# Patient Record
Sex: Female | Born: 1940
Health system: Southern US, Community
[De-identification: ages and names within clinical notes are randomized; demographics above are authoritative.]

## PROBLEM LIST (undated history)

## (undated) DIAGNOSIS — G609 Hereditary and idiopathic neuropathy, unspecified: Secondary | ICD-10-CM

## (undated) DIAGNOSIS — Z860100 Personal history of colon polyps, unspecified: Secondary | ICD-10-CM

## (undated) DIAGNOSIS — J45909 Unspecified asthma, uncomplicated: Secondary | ICD-10-CM

## (undated) DIAGNOSIS — T7840XA Allergy, unspecified, initial encounter: Secondary | ICD-10-CM

## (undated) DIAGNOSIS — E785 Hyperlipidemia, unspecified: Secondary | ICD-10-CM

## (undated) DIAGNOSIS — M1990 Unspecified osteoarthritis, unspecified site: Secondary | ICD-10-CM

## (undated) DIAGNOSIS — M810 Age-related osteoporosis without current pathological fracture: Secondary | ICD-10-CM

## (undated) HISTORY — PX: ABDOMINAL HYSTERECTOMY: SHX81

## (undated) HISTORY — PX: COLONOSCOPY: SHX174

## (undated) HISTORY — PX: APPENDECTOMY: SHX54

---

## 1994-07-21 HISTORY — PX: FOOT SURGERY: SHX648

## 2004-08-08 ENCOUNTER — Ambulatory Visit: Payer: Self-pay | Admitting: Otolaryngology

## 2005-05-27 ENCOUNTER — Ambulatory Visit: Payer: Self-pay | Admitting: Obstetrics and Gynecology

## 2006-06-24 ENCOUNTER — Ambulatory Visit: Payer: Self-pay | Admitting: Obstetrics and Gynecology

## 2007-07-13 ENCOUNTER — Ambulatory Visit: Payer: Self-pay | Admitting: Obstetrics and Gynecology

## 2008-07-18 ENCOUNTER — Ambulatory Visit: Payer: Self-pay | Admitting: Obstetrics and Gynecology

## 2009-06-20 ENCOUNTER — Ambulatory Visit: Payer: Self-pay | Admitting: Internal Medicine

## 2009-06-21 ENCOUNTER — Ambulatory Visit: Payer: Self-pay | Admitting: Obstetrics and Gynecology

## 2009-07-02 ENCOUNTER — Ambulatory Visit: Payer: Self-pay | Admitting: Internal Medicine

## 2009-07-19 ENCOUNTER — Ambulatory Visit: Payer: Self-pay | Admitting: Obstetrics and Gynecology

## 2009-07-21 ENCOUNTER — Ambulatory Visit: Payer: Self-pay | Admitting: Internal Medicine

## 2009-10-29 ENCOUNTER — Ambulatory Visit: Payer: Self-pay | Admitting: Internal Medicine

## 2009-11-18 ENCOUNTER — Ambulatory Visit: Payer: Self-pay | Admitting: Internal Medicine

## 2010-03-04 ENCOUNTER — Ambulatory Visit: Payer: Self-pay | Admitting: Internal Medicine

## 2010-03-04 ENCOUNTER — Ambulatory Visit: Payer: Self-pay

## 2010-03-21 ENCOUNTER — Ambulatory Visit: Payer: Self-pay | Admitting: Internal Medicine

## 2010-04-15 ENCOUNTER — Ambulatory Visit: Payer: Self-pay | Admitting: Internal Medicine

## 2010-04-20 ENCOUNTER — Ambulatory Visit: Payer: Self-pay | Admitting: Internal Medicine

## 2010-05-21 ENCOUNTER — Ambulatory Visit: Payer: Self-pay | Admitting: Internal Medicine

## 2010-06-20 ENCOUNTER — Ambulatory Visit: Payer: Self-pay | Admitting: Internal Medicine

## 2010-07-21 ENCOUNTER — Ambulatory Visit: Payer: Self-pay | Admitting: Internal Medicine

## 2010-07-23 ENCOUNTER — Ambulatory Visit: Payer: Self-pay | Admitting: Obstetrics and Gynecology

## 2011-08-11 ENCOUNTER — Ambulatory Visit: Payer: Self-pay | Admitting: Obstetrics and Gynecology

## 2012-08-18 ENCOUNTER — Ambulatory Visit: Payer: Self-pay | Admitting: Obstetrics and Gynecology

## 2012-12-23 ENCOUNTER — Ambulatory Visit: Payer: Self-pay | Admitting: Surgery

## 2012-12-23 DIAGNOSIS — R0609 Other forms of dyspnea: Secondary | ICD-10-CM

## 2012-12-23 DIAGNOSIS — R0989 Other specified symptoms and signs involving the circulatory and respiratory systems: Secondary | ICD-10-CM

## 2012-12-30 ENCOUNTER — Ambulatory Visit: Payer: Self-pay | Admitting: Surgery

## 2013-08-19 ENCOUNTER — Ambulatory Visit: Payer: Self-pay | Admitting: Obstetrics and Gynecology

## 2014-03-10 ENCOUNTER — Ambulatory Visit: Payer: Self-pay | Admitting: Unknown Physician Specialty

## 2014-03-14 LAB — PATHOLOGY REPORT

## 2014-08-14 ENCOUNTER — Emergency Department: Payer: Self-pay | Admitting: Emergency Medicine

## 2014-08-14 LAB — CBC
HCT: 39.3 % (ref 35.0–47.0)
HGB: 12.9 g/dL (ref 12.0–16.0)
MCH: 33 pg (ref 26.0–34.0)
MCHC: 32.7 g/dL (ref 32.0–36.0)
MCV: 101 fL — ABNORMAL HIGH (ref 80–100)
Platelet: 168 10*3/uL (ref 150–440)
RBC: 3.9 10*6/uL (ref 3.80–5.20)
RDW: 13.9 % (ref 11.5–14.5)
WBC: 4.4 10*3/uL (ref 3.6–11.0)

## 2014-08-14 LAB — BASIC METABOLIC PANEL
Anion Gap: 6 — ABNORMAL LOW (ref 7–16)
BUN: 10 mg/dL (ref 7–18)
Calcium, Total: 9.6 mg/dL (ref 8.5–10.1)
Chloride: 103 mmol/L (ref 98–107)
Co2: 27 mmol/L (ref 21–32)
Creatinine: 0.84 mg/dL (ref 0.60–1.30)
EGFR (African American): >60
EGFR (Non-African Amer.): >60
Glucose: 99 mg/dL (ref 65–99)
Osmolality: 271 (ref 275–301)
Potassium: 3.7 mmol/L (ref 3.5–5.1)
Sodium: 136 mmol/L (ref 136–145)

## 2014-08-14 LAB — TROPONIN I: Troponin-I: <0.02 ng/mL

## 2014-08-21 ENCOUNTER — Ambulatory Visit: Payer: Self-pay | Admitting: Obstetrics and Gynecology

## 2014-09-01 ENCOUNTER — Ambulatory Visit: Payer: Self-pay | Admitting: Obstetrics and Gynecology

## 2014-11-10 NOTE — Op Note (Signed)
PATIENT NAME:  Tiffany, Walton MR#:  [id number] DATE OF BIRTH:  [birth year]  DATE OF PROCEDURE:  12/30/2012  PREOPERATIVE DIAGNOSIS: Left inguinal hernia.   POSTOPERATIVE DIAGNOSIS: Left inguinal hernia.   PROCEDURE: Left inguinal hernia repair.   SURGEON: Rochel Brome, MD   ANESTHESIA: General.   INDICATIONS: This 74 year old female has a history of bulging in the left groin. A left inguinal hernia was demonstrated on physical exam and repair recommended for definitive treatment.   DESCRIPTION OF PROCEDURE: The patient was placed on the operating table in the supine position under general anesthesia. The left lower abdomen was prepared with ChloraPrep and draped in a sterile manner.   A left lower quadrant transversely oriented suprapubic incision was made, carried down through subcutaneous tissues. One traversing vein was divided between 4-0 Vicryl suture ligatures. Several small bleeding points were cauterized. Scarpa's fascia was incised. The external oblique aponeurosis was incised along the course of its fibers exposing an indirect hernia sac which was dissected free from surrounding structures followed up into the internal ring. The sac was opened. Its continuity with the peritoneal cavity was demonstrated. High ligation of the hernia sac was done with a 0 Surgilon suture ligature. The sac was excised. The stump was allowed to retract.  I noted the inferior epigastric vessels. There was a remnant of round ligament which was transected. There was some mild weakness of the floor of the inguinal canal. The repair was carried out with a row of 0 Surgilon sutures suturing the conjoined tendon to the shelving edge of the inguinal ligament incorporating transversalis fascia into the repair and the internal ring was obliterated. Next, the ilioinguinal nerve was replaced along the floor of the inguinal canal. The cut edges of the external oblique aponeurosis were closed with a running 4-0 Vicryl. The  deep fascia superior and lateral to the repair site was infiltrated with 0.5% Sensorcaine with epinephrine. The subcutaneous tissues were infiltrated. Total of 20 mL was used. Scarpa's fascia was closed with interrupted 4-0 Vicryl and the skin was closed with running 4-0 Monocryl subcuticular suture and Dermabond. The patient tolerated surgery satisfactorily and was prepared for transfer to the recovery room. ____________________________ Lenna Sciara. Rochel Brome, MD jws:sb D: 12/30/2012 08:48:07 ET T: 12/30/2012 11:40:24 ET JOB#: [id number]  cc: Loreli Dollar, MD, <Dictator> Loreli Dollar MD ELECTRONICALLY SIGNED 01/10/2013 18:34

## 2015-08-08 ENCOUNTER — Other Ambulatory Visit: Payer: Self-pay | Admitting: Obstetrics and Gynecology

## 2015-08-08 DIAGNOSIS — Z1231 Encounter for screening mammogram for malignant neoplasm of breast: Secondary | ICD-10-CM

## 2015-08-30 ENCOUNTER — Other Ambulatory Visit: Payer: Self-pay | Admitting: Obstetrics and Gynecology

## 2015-08-30 ENCOUNTER — Ambulatory Visit
Admission: RE | Admit: 2015-08-30 | Discharge: 2015-08-30 | Disposition: A | Discharge status: Home / Self Care | Payer: Medicare HMO | Source: Ambulatory Visit | Attending: Obstetrics and Gynecology | Admitting: Obstetrics and Gynecology

## 2015-08-30 DIAGNOSIS — Z1231 Encounter for screening mammogram for malignant neoplasm of breast: Secondary | ICD-10-CM

## 2015-12-28 ENCOUNTER — Other Ambulatory Visit: Payer: Self-pay | Admitting: Orthopedic Surgery

## 2015-12-28 DIAGNOSIS — M542 Cervicalgia: Secondary | ICD-10-CM

## 2015-12-28 DIAGNOSIS — M4726 Other spondylosis with radiculopathy, lumbar region: Secondary | ICD-10-CM

## 2015-12-28 DIAGNOSIS — M5412 Radiculopathy, cervical region: Secondary | ICD-10-CM

## 2015-12-28 DIAGNOSIS — M5030 Other cervical disc degeneration, unspecified cervical region: Secondary | ICD-10-CM

## 2016-01-18 ENCOUNTER — Ambulatory Visit
Admission: RE | Admit: 2016-01-18 | Discharge: 2016-01-18 | Disposition: A | Discharge status: Home / Self Care | Payer: Medicare HMO | Source: Ambulatory Visit | Attending: Orthopedic Surgery | Admitting: Orthopedic Surgery

## 2016-01-18 DIAGNOSIS — M5030 Other cervical disc degeneration, unspecified cervical region: Secondary | ICD-10-CM

## 2016-01-18 DIAGNOSIS — M5412 Radiculopathy, cervical region: Secondary | ICD-10-CM

## 2016-01-18 DIAGNOSIS — M5116 Intervertebral disc disorders with radiculopathy, lumbar region: Secondary | ICD-10-CM | POA: Insufficient documentation

## 2016-01-18 DIAGNOSIS — M4722 Other spondylosis with radiculopathy, cervical region: Secondary | ICD-10-CM | POA: Insufficient documentation

## 2016-01-18 DIAGNOSIS — M4726 Other spondylosis with radiculopathy, lumbar region: Secondary | ICD-10-CM | POA: Diagnosis not present

## 2016-01-18 DIAGNOSIS — M4806 Spinal stenosis, lumbar region: Secondary | ICD-10-CM | POA: Insufficient documentation

## 2016-01-18 DIAGNOSIS — M4802 Spinal stenosis, cervical region: Secondary | ICD-10-CM | POA: Insufficient documentation

## 2016-01-18 DIAGNOSIS — M542 Cervicalgia: Secondary | ICD-10-CM | POA: Diagnosis present

## 2016-06-18 ENCOUNTER — Other Ambulatory Visit: Payer: Self-pay | Admitting: Obstetrics and Gynecology

## 2016-06-18 DIAGNOSIS — Z1231 Encounter for screening mammogram for malignant neoplasm of breast: Secondary | ICD-10-CM

## 2016-09-03 ENCOUNTER — Ambulatory Visit
Admission: RE | Admit: 2016-09-03 | Discharge: 2016-09-03 | Disposition: A | Discharge status: Home / Self Care | Payer: Medicare HMO | Source: Ambulatory Visit | Attending: Obstetrics and Gynecology | Admitting: Obstetrics and Gynecology

## 2016-09-03 DIAGNOSIS — Z1231 Encounter for screening mammogram for malignant neoplasm of breast: Secondary | ICD-10-CM | POA: Diagnosis not present

## 2017-05-08 ENCOUNTER — Encounter: Payer: Self-pay | Admitting: *Deleted

## 2017-05-11 ENCOUNTER — Encounter: Admission: RE | Payer: Self-pay | Source: Ambulatory Visit

## 2017-05-11 ENCOUNTER — Ambulatory Visit
Admission: RE | Admit: 2017-05-11 | Payer: Medicare HMO | Source: Ambulatory Visit | Admitting: Unknown Physician Specialty

## 2017-05-11 HISTORY — DX: Unspecified asthma, uncomplicated: J45.909

## 2017-05-11 HISTORY — DX: Personal history of colonic polyps: Z86.010

## 2017-05-11 HISTORY — DX: Age-related osteoporosis without current pathological fracture: M81.0

## 2017-05-11 HISTORY — DX: Allergy, unspecified, initial encounter: T78.40XA

## 2017-05-11 HISTORY — DX: Hyperlipidemia, unspecified: E78.5

## 2017-05-11 HISTORY — DX: Hereditary and idiopathic neuropathy, unspecified: G60.9

## 2017-05-11 HISTORY — DX: Personal history of colon polyps, unspecified: Z86.0100

## 2017-05-11 SURGERY — COLONOSCOPY WITH PROPOFOL
Anesthesia: General

## 2017-06-17 ENCOUNTER — Other Ambulatory Visit: Payer: Self-pay | Admitting: Obstetrics and Gynecology

## 2017-06-17 DIAGNOSIS — Z1231 Encounter for screening mammogram for malignant neoplasm of breast: Secondary | ICD-10-CM

## 2017-08-04 ENCOUNTER — Encounter: Payer: Self-pay | Admitting: *Deleted

## 2017-08-05 ENCOUNTER — Encounter: Payer: Self-pay | Admitting: *Deleted

## 2017-08-05 ENCOUNTER — Ambulatory Visit: Payer: Medicare HMO | Admitting: Anesthesiology

## 2017-08-05 ENCOUNTER — Ambulatory Visit
Admission: RE | Admit: 2017-08-05 | Discharge: 2017-08-05 | Disposition: A | Discharge status: Home / Self Care | Payer: Medicare HMO | Source: Ambulatory Visit | Attending: Unknown Physician Specialty | Admitting: Unknown Physician Specialty

## 2017-08-05 ENCOUNTER — Encounter
Admission: RE | Disposition: A | Discharge status: Home / Self Care | Payer: Self-pay | Source: Ambulatory Visit | Attending: Unknown Physician Specialty

## 2017-08-05 DIAGNOSIS — J45909 Unspecified asthma, uncomplicated: Secondary | ICD-10-CM | POA: Insufficient documentation

## 2017-08-05 DIAGNOSIS — Z79899 Other long term (current) drug therapy: Secondary | ICD-10-CM | POA: Diagnosis not present

## 2017-08-05 DIAGNOSIS — E785 Hyperlipidemia, unspecified: Secondary | ICD-10-CM | POA: Insufficient documentation

## 2017-08-05 DIAGNOSIS — K640 First degree hemorrhoids: Secondary | ICD-10-CM | POA: Diagnosis not present

## 2017-08-05 DIAGNOSIS — Z1211 Encounter for screening for malignant neoplasm of colon: Secondary | ICD-10-CM | POA: Diagnosis not present

## 2017-08-05 DIAGNOSIS — Z86010 Personal history of colonic polyps: Secondary | ICD-10-CM | POA: Diagnosis not present

## 2017-08-05 DIAGNOSIS — G629 Polyneuropathy, unspecified: Secondary | ICD-10-CM | POA: Diagnosis not present

## 2017-08-05 HISTORY — DX: Unspecified osteoarthritis, unspecified site: M19.90

## 2017-08-05 HISTORY — PX: COLONOSCOPY WITH PROPOFOL: SHX5780

## 2017-08-05 SURGERY — COLONOSCOPY WITH PROPOFOL
Anesthesia: General

## 2017-08-05 MED ORDER — PROPOFOL 10 MG/ML IV BOLUS
INTRAVENOUS | Status: DC | PRN
  Administered 2017-08-05: 300 mg via INTRAVENOUS

## 2017-08-05 MED ORDER — SODIUM CHLORIDE 0.9 % IV SOLN: INTRAVENOUS | Status: DC

## 2017-08-05 MED ORDER — LIDOCAINE HCL (PF) 2 % IJ SOLN
INTRAMUSCULAR | Status: AC
  Filled 2017-08-05: qty 10

## 2017-08-05 MED ORDER — PROPOFOL 500 MG/50ML IV EMUL
INTRAVENOUS | Status: AC
  Filled 2017-08-05: qty 50

## 2017-08-05 MED ORDER — SODIUM CHLORIDE 0.9 % IV SOLN
INTRAVENOUS | Status: DC
  Administered 2017-08-05: 1000 mL via INTRAVENOUS

## 2017-08-05 MED ORDER — LIDOCAINE HCL (CARDIAC) 20 MG/ML IV SOLN
INTRAVENOUS | Status: DC | PRN
  Administered 2017-08-05: 50 mg via INTRAVENOUS

## 2017-08-05 NOTE — Anesthesia Post-op Follow-up Note (Signed)
Anesthesia QCDR form completed.        

## 2017-08-05 NOTE — Op Note (Signed)
[facility] Patient Name: Tiffany Walton Procedure Date: 08/05/2017 12:55 PM MRN: [id number] Account #: [id number] Date of Birth: January 11, 1941 Admit Type: Outpatient Age: 77 Room: [facility] ENDO ROOM 4 Gender: Female Note Status: Finalized Procedure:            Colonoscopy Indications:          Screening for colorectal malignant neoplasm Providers:            Manya Silvas, MD Referring MD:         Irven Easterly. Kary Kos, MD (Referring MD) Medicines:            Propofol per Anesthesia Complications:        No immediate complications. Procedure:            Pre-Anesthesia Assessment:                       - After reviewing the risks and benefits, the patient                        was deemed in satisfactory condition to undergo the                        procedure.                       After obtaining informed consent, the colonoscope was                        passed under direct vision. Throughout the procedure,                        the patient's blood pressure, pulse, and oxygen                        saturations were monitored continuously. The                        Colonoscope was introduced through the anus and                        advanced to the the cecum, identified by appendiceal                        orifice and ileocecal valve. The colonoscopy was                        somewhat difficult due to a tortuous colon. Successful                        completion of the procedure was aided by applying                        abdominal pressure. The patient tolerated the procedure                        well. The quality of the bowel preparation was                        excellent. Findings:      Internal hemorrhoids were found during endoscopy. The hemorrhoids were  small and Grade I (internal hemorrhoids that do not prolapse).      The exam was otherwise without abnormality. Impression:           - Internal hemorrhoids.      - The examination was otherwise normal.                       - No specimens collected. Recommendation:       - The findings and recommendations were discussed with                        the patient. Manya Silvas, MD 08/05/2017 1:21:59 PM This report has been signed electronically. Number of Addenda: 0 Note Initiated On: 08/05/2017 12:55 PM Scope Withdrawal Time: 0 hours 10 minutes 10 seconds  Total Procedure Duration: 0 hours 20 minutes 5 seconds       [facility]

## 2017-08-05 NOTE — Anesthesia Postprocedure Evaluation (Signed)
Anesthesia Post Note  Patient: Tiffany Walton  Procedure(s) Performed: COLONOSCOPY WITH PROPOFOL (N/A )  Patient location during evaluation: PACU Anesthesia Type: General Level of consciousness: awake and alert and oriented Pain management: pain level controlled Vital Signs Assessment: post-procedure vital signs reviewed and stable Respiratory status: spontaneous breathing Cardiovascular status: blood pressure returned to baseline Anesthetic complications: no     Last Vitals:  Vitals:   08/05/17 1330 08/05/17 1340  BP: 107/70 131/74  Pulse:  62  Resp:  (!) 22  Temp:    SpO2:  100%    Last Pain:  Vitals:   08/05/17 1320  TempSrc: Tympanic                 Taquanna Borras

## 2017-08-05 NOTE — Transfer of Care (Signed)
Immediate Anesthesia Transfer of Care Note  Patient: Tiffany Walton  Procedure(s) Performed: COLONOSCOPY WITH PROPOFOL (N/A )  Patient Location: PACU and Endoscopy Unit  Anesthesia Type:MAC  Level of Consciousness: drowsy  Airway & Oxygen Therapy: Patient Spontanous Breathing and Patient connected to nasal cannula oxygen  Post-op Assessment: Report given to RN and Post -op Vital signs reviewed and stable  Post vital signs: Reviewed and stable  Last Vitals:  Vitals:   08/05/17 1225 08/05/17 1320  BP: (!) 144/75 (P) 102/67  Pulse: 74 (P) 83  Resp: 20 (P) 20  Temp: (!) 35.7 C (P) 37 C  SpO2: 100% (P) 100%    Last Pain:  Vitals:   08/05/17 1320  TempSrc: (P) Tympanic         Complications: No apparent anesthesia complications

## 2017-08-05 NOTE — H&P (Signed)
Primary Care Physician:  [place] Pink, MD Primary Gastroenterologist:  Dr. Vira Agar  Pre-Procedure History & Physical: HPI:  Tiffany Walton is a 77 y.o. female is here for an colonoscopy.   Past Medical History:  Diagnosis Date  . Allergic state   . Arthritis    osteoporosis  . Asthma   . History of colon polyps   . Hyperlipidemia   . Osteoporosis, post-menopausal   . Peripheral neuropathy, idiopathic     Past Surgical History:  Procedure Laterality Date  . ABDOMINAL HYSTERECTOMY     TAH  . APPENDECTOMY    . COLONOSCOPY    . FOOT SURGERY  1996    Prior to Admission medications   Medication Sig Start Date End Date Taking? Authorizing Provider  gabapentin (NEURONTIN) 100 MG capsule Take 100 mg by mouth at bedtime.   Yes [provider]  predniSONE (DELTASONE) 10 MG tablet Take 10 mg by mouth daily with breakfast.   Yes [provider]  albuterol (ACCUNEB) 1.25 MG/3ML nebulizer solution Take 1 ampule by nebulization 2 (two) times daily.    [provider]  albuterol (PROVENTIL HFA;VENTOLIN HFA) 108 (90 Base) MCG/ACT inhaler Inhale into the lungs every 6 (six) hours as needed for wheezing or shortness of breath.    [provider]  ascorbic acid (VITAMIN C) 500 MG tablet Take 500 mg by mouth daily.    [provider]  b complex vitamins capsule Take 1 capsule by mouth daily.    [provider]  budesonide-formoterol (SYMBICORT) 160-4.5 MCG/ACT inhaler Inhale 2 puffs into the lungs 2 (two) times daily.    [provider]  cholecalciferol (VITAMIN D) 400 units TABS tablet Take 1,000 Units by mouth daily.    [provider]  COD LIVER OIL PO Take by mouth daily.    [provider]  montelukast (SINGULAIR) 10 MG tablet Take 10 mg by mouth at bedtime.    [provider]  raloxifene (EVISTA) 60 MG tablet Take 60 mg by mouth daily.    [provider]  vitamin E 400 UNIT capsule Take  400 Units by mouth daily.    [provider]    Allergies as of 05/14/2017 - never reviewed  Allergen Reaction Noted  . Shellfish allergy Anaphylaxis 05/08/2017    Family History  Problem Relation Age of Onset  . Breast cancer Neg Hx     Social History   Socioeconomic History  . Marital status: Married    Spouse name: Not on file  . Number of children: Not on file  . Years of education: Not on file  . Highest education level: Not on file  Social Needs  . Financial resource strain: Not on file  . Food insecurity - worry: Not on file  . Food insecurity - inability: Not on file  . Transportation needs - medical: Not on file  . Transportation needs - non-medical: Not on file  Occupational History  . Not on file  Tobacco Use  . Smoking status: Never Smoker  . Smokeless tobacco: Never Used  Substance and Sexual Activity  . Alcohol use: No  . Drug use: No  . Sexual activity: Not on file  Other Topics Concern  . Not on file  Social History Narrative  . Not on file    Review of Systems: See HPI, otherwise negative ROS  Physical Exam: BP (!) 144/75   Pulse 74   Temp (!) 96.3 F (35.7 C) (Tympanic)  Resp 20   Ht 5\' 6"  (1.676 m)   Wt 62.1 kg (137 lb)   SpO2 100%   BMI 22.11 kg/m  General:   Alert,  pleasant and cooperative in NAD Head:  Normocephalic and atraumatic. Neck:  Supple; no masses or thyromegaly. Lungs:  Clear throughout to auscultation.    Heart:  Regular rate and rhythm. Abdomen:  Soft, nontender and nondistended. Normal bowel sounds, without guarding, and without rebound.   Neurologic:  Alert and  oriented x4;  grossly normal neurologically.  Impression/Plan: Tiffany Walton is here for an colonoscopy to be performed for [facility] adenomatous polyp.  Risks, benefits, limitations, and alternatives regarding  colonoscopy have been reviewed with the patient.  Questions have been answered.  All parties agreeable.   Gaylyn Cheers, MD  08/05/2017,  12:52 PM

## 2017-08-05 NOTE — Anesthesia Preprocedure Evaluation (Signed)
Anesthesia Evaluation  Patient identified by MRN, date of birth, ID band Patient awake    Reviewed: Allergy & Precautions, NPO status , Patient's Chart, lab work & pertinent test results  Airway Mallampati: II  TM Distance: >3 FB     Dental  (+) Teeth Intact   Pulmonary asthma ,    Pulmonary exam normal        Cardiovascular negative cardio ROS Normal cardiovascular exam     Neuro/Psych  Neuromuscular disease negative psych ROS   GI/Hepatic negative GI ROS, Neg liver ROS,   Endo/Other  negative endocrine ROS  Renal/GU negative Renal ROS  negative genitourinary   Musculoskeletal  (+) Arthritis , Osteoarthritis,    Abdominal Normal abdominal exam  (+)   Peds negative pediatric ROS (+)  Hematology negative hematology ROS (+)   Anesthesia Other Findings   Reproductive/Obstetrics                             Anesthesia Physical Anesthesia Plan  ASA: II  Anesthesia Plan:    Post-op Pain Management:    Induction: Intravenous  PONV Risk Score and Plan:   Airway Management Planned: Nasal Cannula  Additional Equipment:   Intra-op Plan:   Post-operative Plan:   Informed Consent: I have reviewed the patients History and Physical, chart, labs and discussed the procedure including the risks, benefits and alternatives for the proposed anesthesia with the patient or authorized representative who has indicated his/her understanding and acceptance.   Dental advisory given  Plan Discussed with: CRNA and Surgeon  Anesthesia Plan Comments:         Anesthesia Quick Evaluation

## 2017-08-06 ENCOUNTER — Encounter: Payer: Self-pay | Admitting: Unknown Physician Specialty

## 2017-09-09 ENCOUNTER — Ambulatory Visit
Admission: RE | Admit: 2017-09-09 | Discharge: 2017-09-09 | Disposition: A | Discharge status: Home / Self Care | Payer: Medicare HMO | Source: Ambulatory Visit | Attending: Obstetrics and Gynecology | Admitting: Obstetrics and Gynecology

## 2017-09-09 DIAGNOSIS — Z1231 Encounter for screening mammogram for malignant neoplasm of breast: Secondary | ICD-10-CM

## 2018-09-08 ENCOUNTER — Other Ambulatory Visit: Payer: Self-pay | Admitting: Obstetrics and Gynecology

## 2018-09-08 DIAGNOSIS — Z1231 Encounter for screening mammogram for malignant neoplasm of breast: Secondary | ICD-10-CM

## 2018-09-10 DIAGNOSIS — R05 Cough: Secondary | ICD-10-CM | POA: Diagnosis not present

## 2018-09-10 DIAGNOSIS — J40 Bronchitis, not specified as acute or chronic: Secondary | ICD-10-CM | POA: Diagnosis not present

## 2018-09-14 DIAGNOSIS — Z124 Encounter for screening for malignant neoplasm of cervix: Secondary | ICD-10-CM | POA: Diagnosis not present

## 2018-09-14 DIAGNOSIS — N893 Dysplasia of vagina, unspecified: Secondary | ICD-10-CM | POA: Diagnosis not present

## 2018-09-14 DIAGNOSIS — Z1239 Encounter for other screening for malignant neoplasm of breast: Secondary | ICD-10-CM | POA: Diagnosis not present

## 2018-09-14 DIAGNOSIS — Z1272 Encounter for screening for malignant neoplasm of vagina: Secondary | ICD-10-CM | POA: Diagnosis not present

## 2018-09-17 ENCOUNTER — Ambulatory Visit
Admission: RE | Admit: 2018-09-17 | Discharge: 2018-09-17 | Disposition: A | Discharge status: Home / Self Care | Payer: Medicare HMO | Source: Ambulatory Visit | Attending: Obstetrics and Gynecology | Admitting: Obstetrics and Gynecology

## 2018-09-17 DIAGNOSIS — Z1231 Encounter for screening mammogram for malignant neoplasm of breast: Secondary | ICD-10-CM | POA: Insufficient documentation

## 2018-10-14 DIAGNOSIS — Z1211 Encounter for screening for malignant neoplasm of colon: Secondary | ICD-10-CM | POA: Diagnosis not present

## 2018-11-10 DIAGNOSIS — J4520 Mild intermittent asthma, uncomplicated: Secondary | ICD-10-CM | POA: Diagnosis not present

## 2018-11-10 DIAGNOSIS — Z0000 Encounter for general adult medical examination without abnormal findings: Secondary | ICD-10-CM | POA: Diagnosis not present

## 2018-11-10 DIAGNOSIS — E119 Type 2 diabetes mellitus without complications: Secondary | ICD-10-CM | POA: Diagnosis not present

## 2018-11-10 DIAGNOSIS — E785 Hyperlipidemia, unspecified: Secondary | ICD-10-CM | POA: Diagnosis not present

## 2018-11-15 DIAGNOSIS — Z0000 Encounter for general adult medical examination without abnormal findings: Secondary | ICD-10-CM | POA: Diagnosis not present

## 2018-11-15 DIAGNOSIS — E785 Hyperlipidemia, unspecified: Secondary | ICD-10-CM | POA: Diagnosis not present

## 2018-11-15 DIAGNOSIS — E119 Type 2 diabetes mellitus without complications: Secondary | ICD-10-CM | POA: Diagnosis not present

## 2018-11-25 DIAGNOSIS — J4520 Mild intermittent asthma, uncomplicated: Secondary | ICD-10-CM | POA: Diagnosis not present

## 2018-12-16 DIAGNOSIS — H25813 Combined forms of age-related cataract, bilateral: Secondary | ICD-10-CM | POA: Diagnosis not present

## 2019-04-13 DIAGNOSIS — Z23 Encounter for immunization: Secondary | ICD-10-CM | POA: Diagnosis not present

## 2019-04-13 DIAGNOSIS — E119 Type 2 diabetes mellitus without complications: Secondary | ICD-10-CM | POA: Diagnosis not present

## 2019-04-13 DIAGNOSIS — G609 Hereditary and idiopathic neuropathy, unspecified: Secondary | ICD-10-CM | POA: Diagnosis not present

## 2019-04-13 DIAGNOSIS — Z0000 Encounter for general adult medical examination without abnormal findings: Secondary | ICD-10-CM | POA: Diagnosis not present

## 2019-04-22 DIAGNOSIS — Z23 Encounter for immunization: Secondary | ICD-10-CM | POA: Diagnosis not present

## 2019-05-16 ENCOUNTER — Other Ambulatory Visit: Payer: Self-pay

## 2019-05-16 DIAGNOSIS — R0600 Dyspnea, unspecified: Secondary | ICD-10-CM | POA: Diagnosis not present

## 2019-05-16 DIAGNOSIS — R062 Wheezing: Secondary | ICD-10-CM | POA: Diagnosis not present

## 2019-05-16 DIAGNOSIS — J4520 Mild intermittent asthma, uncomplicated: Secondary | ICD-10-CM | POA: Diagnosis not present

## 2019-05-16 DIAGNOSIS — Z20822 Contact with and (suspected) exposure to covid-19: Secondary | ICD-10-CM

## 2019-05-17 LAB — NOVEL CORONAVIRUS, NAA: SARS-CoV-2, NAA: NOT DETECTED

## 2019-06-06 DIAGNOSIS — G600 Hereditary motor and sensory neuropathy: Secondary | ICD-10-CM | POA: Diagnosis not present

## 2019-06-06 DIAGNOSIS — R413 Other amnesia: Secondary | ICD-10-CM | POA: Diagnosis not present

## 2019-07-20 DIAGNOSIS — I872 Venous insufficiency (chronic) (peripheral): Secondary | ICD-10-CM | POA: Diagnosis not present

## 2019-08-29 ENCOUNTER — Other Ambulatory Visit: Payer: Self-pay | Admitting: Obstetrics and Gynecology

## 2019-08-29 ENCOUNTER — Other Ambulatory Visit: Payer: Self-pay | Admitting: Family Medicine

## 2019-08-29 DIAGNOSIS — Z1231 Encounter for screening mammogram for malignant neoplasm of breast: Secondary | ICD-10-CM

## 2019-09-20 DIAGNOSIS — J310 Chronic rhinitis: Secondary | ICD-10-CM | POA: Diagnosis not present

## 2019-09-20 DIAGNOSIS — M8588 Other specified disorders of bone density and structure, other site: Secondary | ICD-10-CM | POA: Diagnosis not present

## 2019-09-20 DIAGNOSIS — M8580 Other specified disorders of bone density and structure, unspecified site: Secondary | ICD-10-CM | POA: Diagnosis not present

## 2019-09-20 DIAGNOSIS — J4520 Mild intermittent asthma, uncomplicated: Secondary | ICD-10-CM | POA: Diagnosis not present

## 2019-09-20 DIAGNOSIS — Z124 Encounter for screening for malignant neoplasm of cervix: Secondary | ICD-10-CM | POA: Diagnosis not present

## 2019-09-20 DIAGNOSIS — D2371 Other benign neoplasm of skin of right lower limb, including hip: Secondary | ICD-10-CM | POA: Diagnosis not present

## 2019-09-20 DIAGNOSIS — E119 Type 2 diabetes mellitus without complications: Secondary | ICD-10-CM | POA: Diagnosis not present

## 2019-09-20 DIAGNOSIS — R87612 Low grade squamous intraepithelial lesion on cytologic smear of cervix (LGSIL): Secondary | ICD-10-CM | POA: Diagnosis not present

## 2019-09-20 DIAGNOSIS — M79672 Pain in left foot: Secondary | ICD-10-CM | POA: Diagnosis not present

## 2019-09-20 DIAGNOSIS — Z1272 Encounter for screening for malignant neoplasm of vagina: Secondary | ICD-10-CM | POA: Diagnosis not present

## 2019-09-20 DIAGNOSIS — M79671 Pain in right foot: Secondary | ICD-10-CM | POA: Diagnosis not present

## 2019-09-20 DIAGNOSIS — Z1211 Encounter for screening for malignant neoplasm of colon: Secondary | ICD-10-CM | POA: Diagnosis not present

## 2019-09-20 DIAGNOSIS — Z1231 Encounter for screening mammogram for malignant neoplasm of breast: Secondary | ICD-10-CM | POA: Diagnosis not present

## 2019-09-20 DIAGNOSIS — B351 Tinea unguium: Secondary | ICD-10-CM | POA: Diagnosis not present

## 2019-10-17 ENCOUNTER — Ambulatory Visit: Admission: RE | Admit: 2019-10-17 | Payer: Medicare HMO | Source: Ambulatory Visit

## 2019-10-24 ENCOUNTER — Ambulatory Visit
Admission: RE | Admit: 2019-10-24 | Discharge: 2019-10-24 | Disposition: A | Discharge status: Home / Self Care | Payer: Medicare HMO | Source: Ambulatory Visit | Attending: Obstetrics and Gynecology | Admitting: Obstetrics and Gynecology

## 2019-10-24 DIAGNOSIS — Z1231 Encounter for screening mammogram for malignant neoplasm of breast: Secondary | ICD-10-CM | POA: Diagnosis not present

## 2019-11-22 DIAGNOSIS — N890 Mild vaginal dysplasia: Secondary | ICD-10-CM | POA: Diagnosis not present

## 2019-12-22 DIAGNOSIS — M7989 Other specified soft tissue disorders: Secondary | ICD-10-CM | POA: Diagnosis not present

## 2019-12-22 DIAGNOSIS — M1611 Unilateral primary osteoarthritis, right hip: Secondary | ICD-10-CM | POA: Diagnosis not present

## 2019-12-22 DIAGNOSIS — M79671 Pain in right foot: Secondary | ICD-10-CM | POA: Diagnosis not present

## 2019-12-22 DIAGNOSIS — M25561 Pain in right knee: Secondary | ICD-10-CM | POA: Diagnosis not present

## 2019-12-22 DIAGNOSIS — M25551 Pain in right hip: Secondary | ICD-10-CM | POA: Diagnosis not present

## 2019-12-22 DIAGNOSIS — M25461 Effusion, right knee: Secondary | ICD-10-CM | POA: Diagnosis not present

## 2020-02-09 DIAGNOSIS — R296 Repeated falls: Secondary | ICD-10-CM | POA: Diagnosis not present

## 2020-02-09 DIAGNOSIS — R42 Dizziness and giddiness: Secondary | ICD-10-CM | POA: Diagnosis not present

## 2020-02-09 DIAGNOSIS — M25561 Pain in right knee: Secondary | ICD-10-CM | POA: Diagnosis not present

## 2020-02-27 DIAGNOSIS — J4520 Mild intermittent asthma, uncomplicated: Secondary | ICD-10-CM | POA: Diagnosis not present

## 2020-03-01 DIAGNOSIS — R42 Dizziness and giddiness: Secondary | ICD-10-CM | POA: Diagnosis not present

## 2020-03-02 DIAGNOSIS — H52223 Regular astigmatism, bilateral: Secondary | ICD-10-CM | POA: Diagnosis not present

## 2020-03-02 DIAGNOSIS — H5203 Hypermetropia, bilateral: Secondary | ICD-10-CM | POA: Diagnosis not present

## 2020-03-02 DIAGNOSIS — H35373 Puckering of macula, bilateral: Secondary | ICD-10-CM | POA: Diagnosis not present

## 2020-03-02 DIAGNOSIS — H25813 Combined forms of age-related cataract, bilateral: Secondary | ICD-10-CM | POA: Diagnosis not present

## 2020-03-02 DIAGNOSIS — H524 Presbyopia: Secondary | ICD-10-CM | POA: Diagnosis not present

## 2020-03-17 IMAGING — MG DIGITAL SCREENING BILATERAL MAMMOGRAM WITH TOMO AND CAD
8 series · 9 of 24 positions shown · non-contrast
Comparison: Previous exam(s).

CLINICAL DATA: Screening.

EXAM:
DIGITAL SCREENING BILATERAL MAMMOGRAM WITH TOMO AND CAD

[L MLO synth-2D]
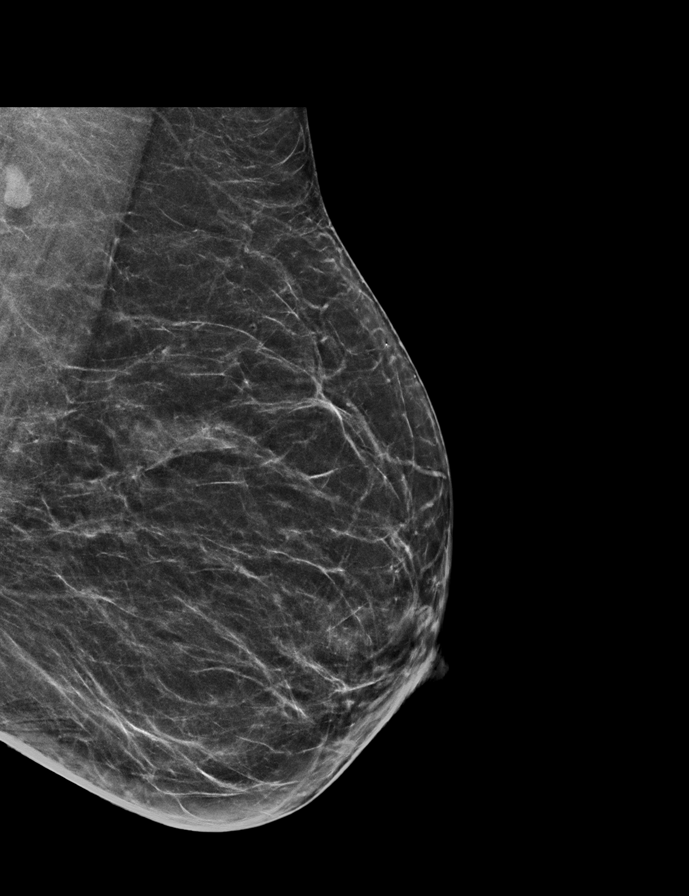

[R MLO synth-2D]
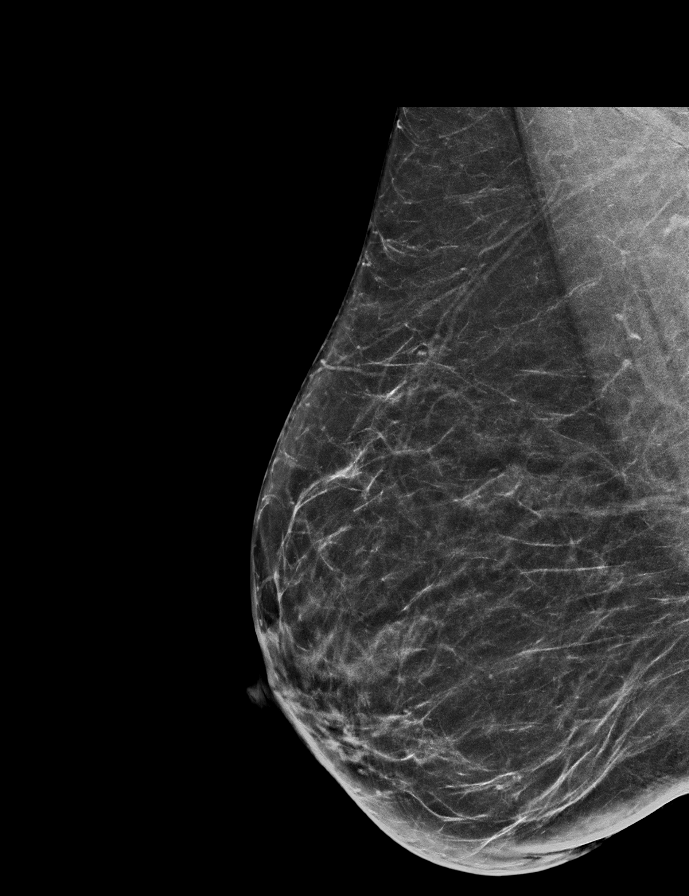

[R CC synth-2D]
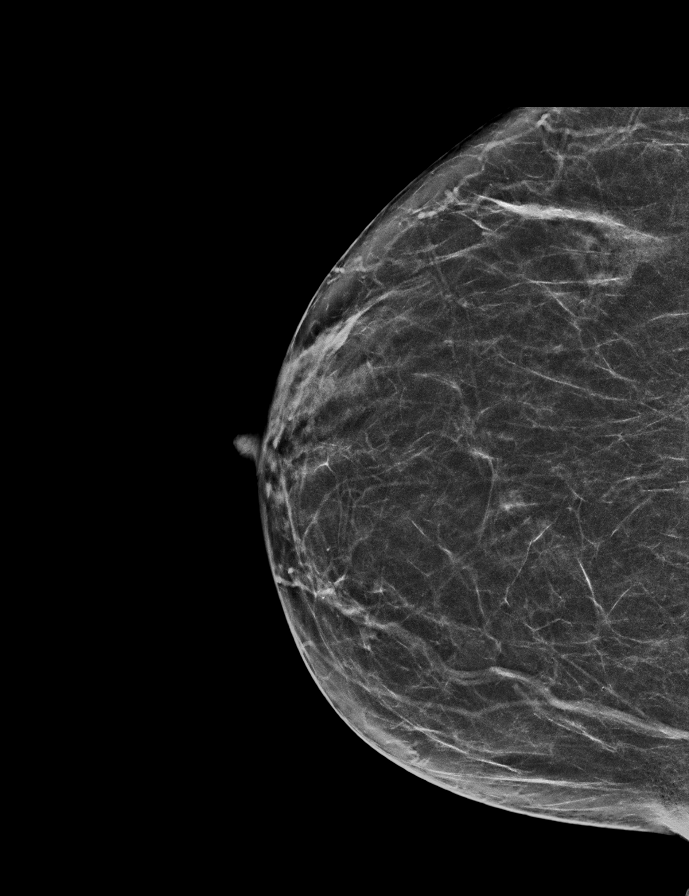

[L CC synth-2D]
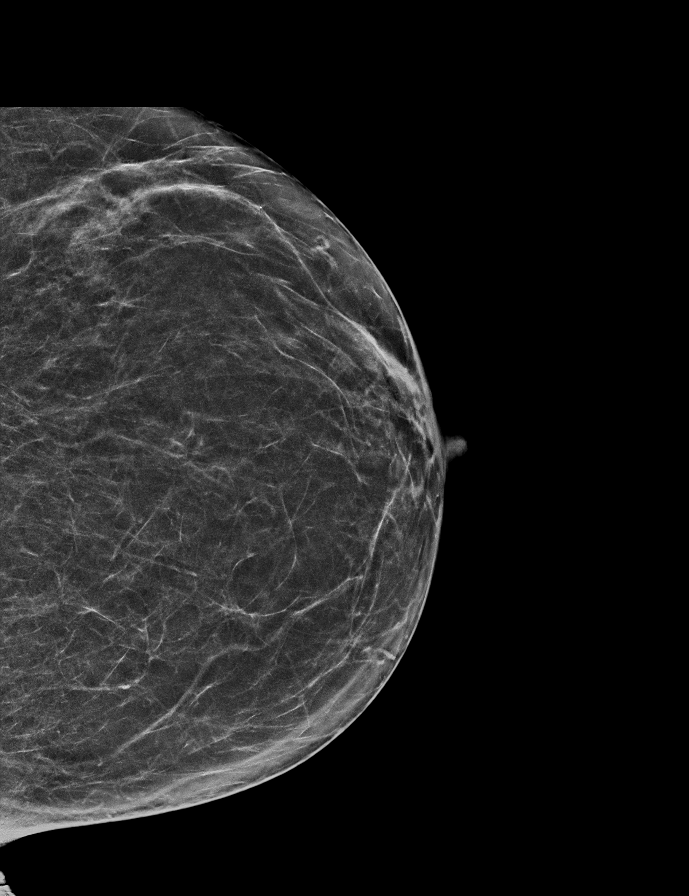

[L MLO tomo · 2 of 64 frames shown]
[frame 21/64]
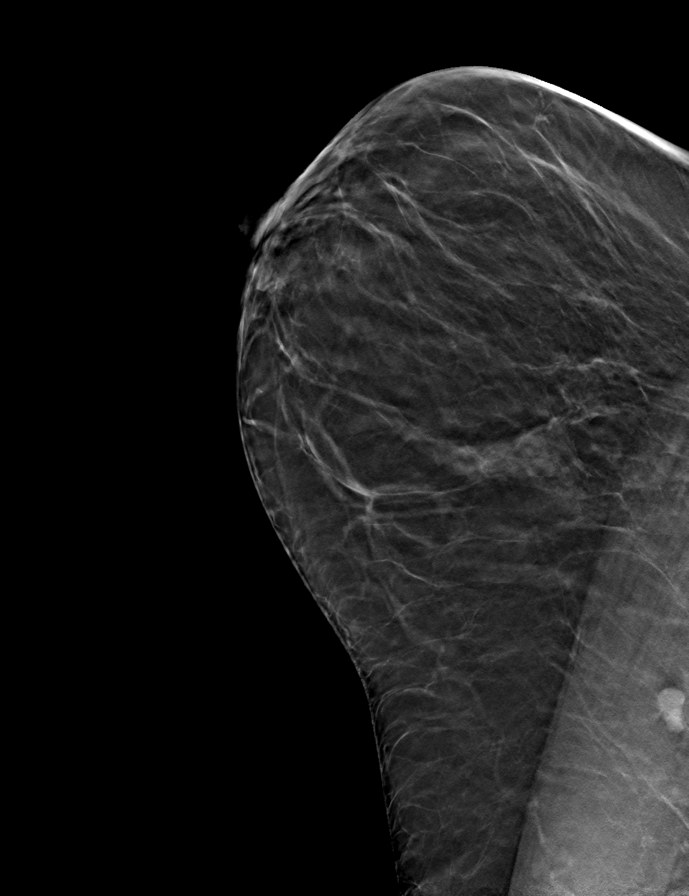
[frame 33/64]
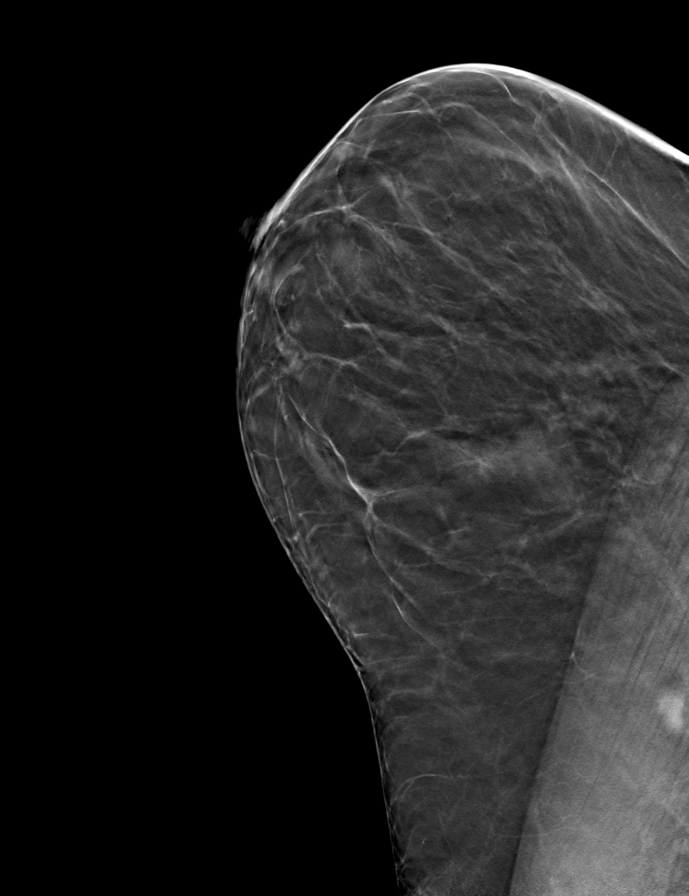

[R MLO tomo · tomo slice 33/66.0]
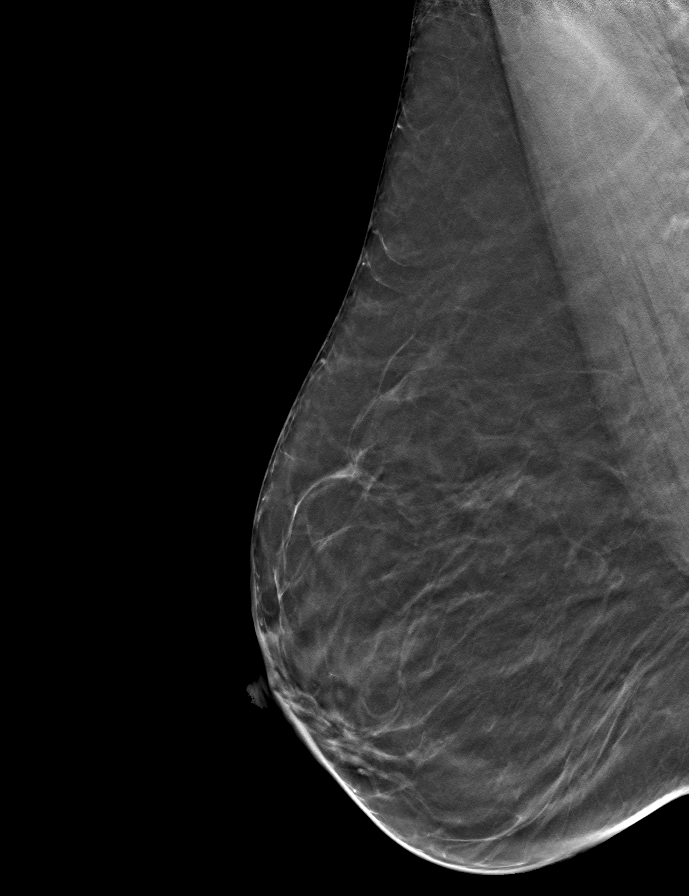

[L CC tomo · tomo slice 29/57.0]
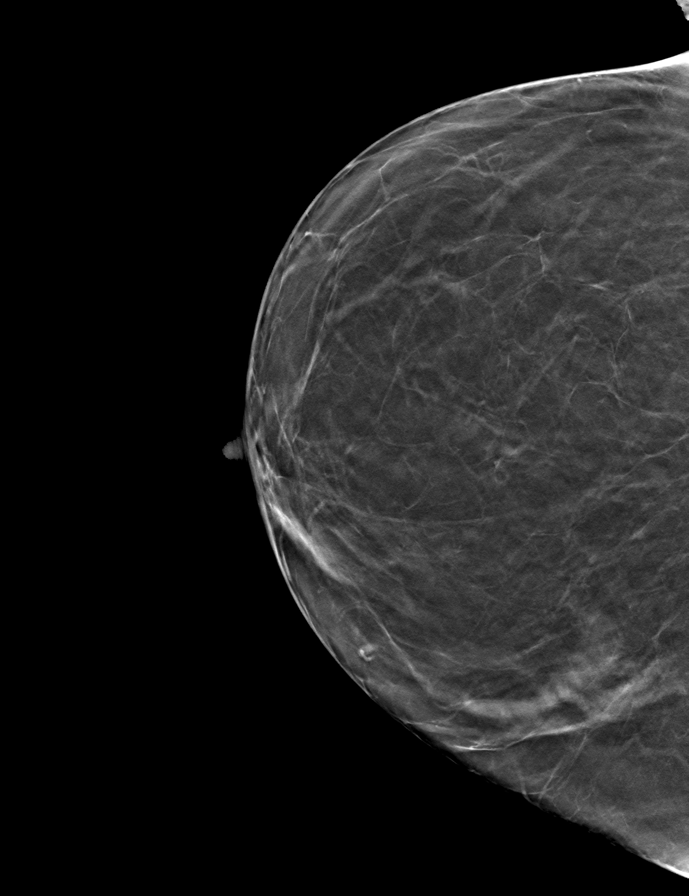

[R CC tomo · tomo slice 31/60.0]
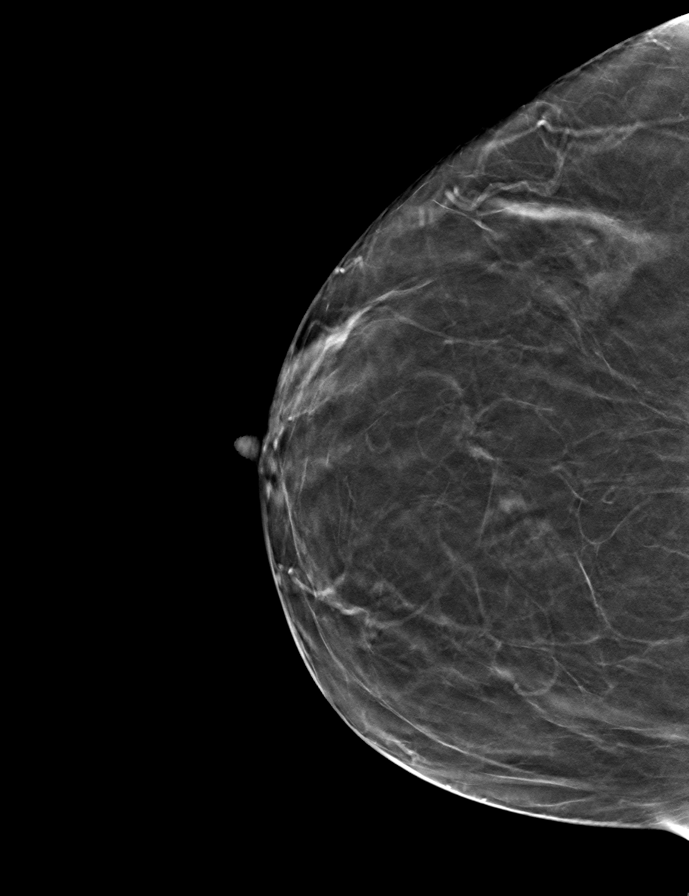

[9 of 24 positions shown; findings below may reference images not displayed]

ACR Breast Density Category b: There are scattered areas of
fibroglandular density.
FINDINGS: There are no findings suspicious for malignancy. Images were
processed with CAD.
IMPRESSION: No mammographic evidence of malignancy. A result letter of this
screening mammogram will be mailed directly to the patient.

RECOMMENDATION:
Screening mammogram in one year. (Code:[id number]-[id number])

BI-RADS CATEGORY  1: Negative.

## 2020-03-23 DIAGNOSIS — Z01818 Encounter for other preprocedural examination: Secondary | ICD-10-CM | POA: Diagnosis not present

## 2020-03-23 DIAGNOSIS — R0600 Dyspnea, unspecified: Secondary | ICD-10-CM | POA: Diagnosis not present

## 2020-04-12 DIAGNOSIS — E119 Type 2 diabetes mellitus without complications: Secondary | ICD-10-CM | POA: Diagnosis not present

## 2020-04-12 DIAGNOSIS — Z0000 Encounter for general adult medical examination without abnormal findings: Secondary | ICD-10-CM | POA: Diagnosis not present

## 2020-04-12 DIAGNOSIS — E785 Hyperlipidemia, unspecified: Secondary | ICD-10-CM | POA: Diagnosis not present

## 2020-04-19 DIAGNOSIS — M25561 Pain in right knee: Secondary | ICD-10-CM | POA: Diagnosis not present

## 2020-04-19 DIAGNOSIS — Z0000 Encounter for general adult medical examination without abnormal findings: Secondary | ICD-10-CM | POA: Diagnosis not present

## 2020-04-19 DIAGNOSIS — Z23 Encounter for immunization: Secondary | ICD-10-CM | POA: Diagnosis not present

## 2020-04-19 DIAGNOSIS — R42 Dizziness and giddiness: Secondary | ICD-10-CM | POA: Diagnosis not present

## 2020-04-19 DIAGNOSIS — G3184 Mild cognitive impairment, so stated: Secondary | ICD-10-CM | POA: Diagnosis not present

## 2020-04-30 DIAGNOSIS — M899 Disorder of bone, unspecified: Secondary | ICD-10-CM | POA: Diagnosis not present

## 2020-04-30 DIAGNOSIS — M25561 Pain in right knee: Secondary | ICD-10-CM | POA: Diagnosis not present

## 2020-05-21 ENCOUNTER — Other Ambulatory Visit: Payer: Self-pay

## 2020-05-21 ENCOUNTER — Ambulatory Visit: Payer: Medicare HMO | Attending: Neurology

## 2020-05-21 DIAGNOSIS — R42 Dizziness and giddiness: Secondary | ICD-10-CM | POA: Diagnosis not present

## 2020-05-21 NOTE — Patient Instructions (Addendum)
Access Code: [email or web address] URL: [email or web address] Date: 05/21/2020 Prepared by: Roxana Hires  Exercises Self-Epley Maneuver Right Ear - 1 x daily - 7 x weekly - 3 reps - 60s in each position hold  Patient Education BPPV

## 2020-05-21 NOTE — Therapy (Signed)
[place] MAIN [facility] SERVICES [address] [place], [place], [address] Phone: [phone]   Fax:  [phone]  Physical Therapy Evaluation  Patient Details  Name: Tiffany Walton MRN: [id number] Date of Birth: May 17, 1941 Referring Provider (PT): Dr. Manuella Ghazi   Encounter Date: 05/21/2020   PT End of Session - 05/21/20 1546    Visit Number 1    Number of Visits 9    Date for PT Re-Evaluation 07/16/20    Authorization Type eval: 05/21/20    PT Start Time 1528    PT Stop Time 1615    PT Time Calculation (min) 47 min    Activity Tolerance Patient tolerated treatment well    Behavior During Therapy [facility] for tasks assessed/performed           Past Medical History:  Diagnosis Date  . Allergic state   . Arthritis    osteoporosis  . Asthma   . History of colon polyps   . Hyperlipidemia   . Osteoporosis, post-menopausal   . Peripheral neuropathy, idiopathic     Past Surgical History:  Procedure Laterality Date  . ABDOMINAL HYSTERECTOMY     TAH  . APPENDECTOMY    . COLONOSCOPY    . COLONOSCOPY WITH PROPOFOL N/A 08/05/2017   Procedure: COLONOSCOPY WITH PROPOFOL;  Surgeon: Manya Silvas, MD;  Location: [facility] ENDOSCOPY;  Service: Endoscopy;  Laterality: N/A;  . FOOT SURGERY  1996    There were no vitals filed for this visit.    Subjective Assessment - 05/21/20 1628    Subjective Dizziness    Pertinent History Pt states she has been having episodes of dizziness since around July 2021. It has been unchanged since it first started. She states that it occurs once to twice per day. Symptoms lasts a few seconds to a few minutes. Pt reports that she feels like she is "blacking out" but "not losing consciousness." Symptoms occur at night after she gets on her knees to say her bedtime prayers and stands up. History of idiopathic neuropathy in bilateral feet for many years. Per neurology note pt also has some low back pain. Pt is not a great  historian and has difficulty describing aggravating and easing factors. Denies history of headaches or migraines. Denies chest pains or palpitations.    Diagnostic tests None reported    Patient Stated Goals Resolve dizziness    Currently in Pain? Other (Comment)   Unrelated to current episode          VESTIBULAR AND BALANCE EVALUATION   HISTORY:  Subjective history of current problem: Pt states she has been having episodes of dizziness since around July 2021. It has been unchanged since it first started. She states that it occurs once to twice per day. Symptoms lasts a few seconds to a few minutes. Pt reports that she feels like she is "blacking out" but "not losing consciousness." Symptoms occur at night after she gets on her knees to say her bedtime prayers and stands up. History of idiopathic neuropathy in bilateral feet for many years. Per neurology note pt also has some low back pain. Pt is not a great historian and has difficulty describing aggravating and easing factors. Denies history of headaches or migraines. Denies chest pains or palpitations.   Description of dizziness: (vertigo, unsteadiness, lightheadedness, falling, general unsteadiness, whoozy, swimmy-headed sensation, aural fullness) lightheadedness, "black out" Frequency: once to twice per day  Duration: seconds to minutes Symptom nature: (motion provoked, positional, spontaneous, constant,  variable, intermittent) motion-provoked, never spontaneous  Provocative Factors: getting up from knees after praying, otherwise pt is unsure what causes symptoms Easing Factors: Waiting for symptoms to pass  Progression of symptoms: (better, worse, no change since onset) no change History of similar episodes: None  Falls (yes/no): No  Prior Functional Level: Independent with ADLs/IADLs, 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     EXAMINATION  POSTURE: WNL  NEUROLOGICAL SCREEN: (2+ unless otherwise noted.) N=normal  Ab=abnormal  Level Dermatome R L Myotome R L Reflex R L  C3 Anterior Neck N N Sidebend C2-3 N N Jaw CN V    C4 Top of Shoulder N N Shoulder Shrug C4 N N Hoffman's UMN    C5 Lateral Upper Arm N N Shoulder ABD C4-5 N N Biceps C5-6    C6 Lateral Arm/ Thumb N N Arm Flex/ Wrist Ext C5-6 N N Brachiorad. C5-6    C7 Middle Finger N N Arm Ext//Wrist Flex C6-7 N N Triceps C7    C8 4th & 5th Finger N N Flex/ Ext Carpi Ulnaris C8 N N Patellar (L3-4)    T1 Medial Arm N N Interossei T1 N N Gastrocnemius    L2 Medial thigh/groin N N Illiopsoas (L2-3) N N     L3 Lower thigh/med.knee N N Quadriceps (L3-4) N N     L4 Medial leg/lat thigh N N Tibialis Ant (L4-5) N N     L5 Lat. leg & dorsal foot N N EHL (L5) N N     S1 post/lat foot/thigh/leg N N Gastrocnemius (S1-2) N N     S2 Post./med. thigh & leg N N Hamstrings (L4-S3) N N       Cranial Nerves Visual acuity and visual fields are intact  Extraocular muscles are intact  Facial sensation is intact bilaterally  Facial strength is intact bilaterally  Hearing is normal as tested by gross conversation Palate elevates midline, normal phonation  Shoulder shrug strength is intact  Tongue protrudes midline    SOMATOSENSORY:         Sensation           Intact      Diminished         Absent  Light touch Normal      COORDINATION: Finger to Nose: Normal Heel to Shin: Normal Pronator Drift: Negative Rapid Alternating Movements: Normal Finger to Thumb Opposition: Normal  MUSCULOSKELETAL SCREEN: Cervical Spine ROM: WFL and painless in all planes. No gross deficits  identified   ROM: WNL  MMT: WNL  Functional Mobility: Independent for transfers and ambulation without UE support;  Gait: Scanning of visual environment with gait is: WNL   Clinical Test of Sensory Interaction for Balance    (CTSIB): Deferred     OCULOMOTOR / VESTIBULAR TESTING:  Oculomotor Exam- Room Light  Findings Comments  Ocular Alignment normal   Ocular ROM normal   Spontaneous Nystagmus normal   Gaze-Holding Nystagmus normal   End-Gaze Nystagmus normal   Vergence (normal 2-3") not examined   Smooth Pursuit abnormal Saccadic  Cross-Cover Test not examined   Saccades normal   VOR Cancellation normal   Left  Head Impulse abnormal Consistent corrective saccade  Right Head Impulse normal   Static Acuity not examined   Dynamic Acuity not examined    Oculomotor Exam- Fixation Suppressed: Deferred  BPPV TESTS:  Symptoms Duration Intensity Nystagmus  L Dix-Hallpike None   None  R Dix-Hallpike Vertigo 10-15s 8/10 Upbeating R torsional nystagmus  L Head Roll None   None  R Head Roll None   None  L Sidelying Test      R Sidelying Test        FUNCTIONAL OUTCOME MEASURES   Results Comments  ABC Scale 95% WNL  DHI 12/100 Mild perception of handicap  FOTO 89 Predicted decline to 92          [facility] PT Assessment - 05/21/20 1630      Assessment   Medical Diagnosis Dizziness    Referring Provider (PT) Dr. Manuella Ghazi    Onset Date/Surgical Date 01/19/20   Approximate   Hand Dominance Right    Next MD Visit not reported    Prior Therapy Not for this issue      Precautions   Precautions None      Restrictions   Weight Bearing Restrictions No      Balance Screen   Has the patient fallen in the past 6 months No    Has the patient had a decrease in activity level because of a fear of falling?  No    Is the patient reluctant to leave their home because of a fear of falling?  No      Home Ecologist residence    Living Arrangements  Spouse/significant other    Available Help at Discharge Family    Type of [place] Two level    Alternate Level Stairs-Number of Steps 10    Alternate Level Stairs-Rails Right      Prior Function   Level of Independence Independent    Vocation Part time employment    Vocation Requirements Works as Scientist, water quality at [employer] Status Within Functional Limits for tasks assessed                  Objective measurements completed on examination: See above findings.      TREATMENT   Canalith Repositioning Treatment Pt treated with 2 bouts of Epley Maneuver for presumed R posterior canal BPPV. 1 minute holds in each position and retesting between maneuvers as well as after final maneuver. Retesting is negative for vertigo and nystagmus. Pt and daughter educated about BPPV and provided instruction about how to perform home Epley maneuver for R side.          PT Education - 05/21/20 1546    Education Details Plan of care, home Epley maneuver, BPPV    Person(s) Educated Patient;Child(ren)    Methods Explanation;Handout;Demonstration    Comprehension Verbalized understanding            PT Short Term Goals - 05/21/20 1549      PT SHORT TERM GOAL #1   Title Pt will be independent with HEP in order to decrease dizziness in order to decrease fall risk and improve function at home and work.    Time 4    Period Weeks    Status New    Target Date 06/18/20             PT Long Term Goals - 05/21/20 1634  PT LONG TERM GOAL #1   Title Pt will report no further episodes of dizziness when getting up from her knees after praying at night    Time 8    Period Weeks    Status New    Target Date 07/16/20      PT LONG TERM GOAL #2   Title Pt will decrease DHI score to 0 in order to demonstrate clinically significant reduction in disability    Baseline 05/21/20: 12/100    Time 8    Period Weeks    Status New    Target Date  07/16/20                  Plan - 05/21/20 1547    Clinical Impression Statement Pt is a pleasant 79 year-old female referred to vestibular physical therapy for dizziness. PT examination positive for R posterior canal BPPV. Pt treated today with the Epley maneuver with resolution of her symptoms. Prescribed home Epley maneuver for HEP. Pt encouraged to follow-up as scheduled for retesting and treatment if necessary. Pt will benefit from skilled PT services to address dizziness/BPPV and improve symptom-free function at home and work.    Personal Factors and Comorbidities Age;Comorbidity 2    Comorbidities OA, osteoporosis, peripheral neuropathy    Examination-Activity Limitations Locomotion Level    Stability/Clinical Decision Making Stable/Uncomplicated    Clinical Decision Making Low    Rehab Potential Good    PT Frequency 1x / week    PT Duration 8 weeks    PT Treatment/Interventions ADLs/Self Care Home Management;Aquatic Therapy;Biofeedback;Canalith Repostioning;Cryotherapy;Electrical Stimulation;Iontophoresis 4mg /ml Dexamethasone;Moist Heat;Traction;Ultrasound;DME Instruction;Gait training;Stair training;Functional mobility training;Therapeutic activities;Therapeutic exercise;Balance training;Neuromuscular re-education;Patient/family education;Manual techniques;Passive range of motion;Dry needling;Vestibular;Joint Manipulations    PT Next Visit Plan Retest for BPPV and treat as appropriate, review home Epley    PT Home Exercise Plan Access Code: [email or web address]    Consulted and Agree with Plan of Care Patient           Patient will benefit from skilled therapeutic intervention in order to improve the following deficits and impairments:  Dizziness  Visit Diagnosis: Dizziness and giddiness - Plan: PT plan of care cert/re-cert     Problem List There are no problems to display for this patient.  Phillips Grout PT, DPT, GCS  Vanita Cannell 05/21/2020, 5:05 PM  [place] MAIN [facility] SERVICES [address] [place], [place], [address] Phone: [phone]   Fax:  [phone]  Name: Tiffany Walton MRN: [id number] Date of Birth: 12-21-1940

## 2020-05-30 ENCOUNTER — Other Ambulatory Visit: Payer: Self-pay

## 2020-05-30 ENCOUNTER — Ambulatory Visit: Payer: Medicare HMO

## 2020-05-30 DIAGNOSIS — M1711 Unilateral primary osteoarthritis, right knee: Secondary | ICD-10-CM | POA: Diagnosis not present

## 2020-05-30 DIAGNOSIS — R42 Dizziness and giddiness: Secondary | ICD-10-CM | POA: Diagnosis not present

## 2020-05-30 NOTE — Therapy (Signed)
[place] MAIN [facility] SERVICES [address] [place], [place], [address] Phone: [phone]   Fax:  [phone]  Physical Therapy Treatment  Patient Details  Name: BRACHA FRANKOWSKI MRN: [id number] Date of Birth: 15-Nov-1940 Referring Provider (PT): Dr. Manuella Ghazi   Encounter Date: 05/30/2020   PT End of Session - 05/30/20 1022    Visit Number 2    Number of Visits 9    Date for PT Re-Evaluation 07/16/20    Authorization Type eval: 05/21/20    PT Start Time 1018    PT Stop Time 1100    PT Time Calculation (min) 42 min    Activity Tolerance Patient tolerated treatment well    Behavior During Therapy [facility] for tasks assessed/performed           Past Medical History:  Diagnosis Date  . Allergic state   . Arthritis    osteoporosis  . Asthma   . History of colon polyps   . Hyperlipidemia   . Osteoporosis, post-menopausal   . Peripheral neuropathy, idiopathic     Past Surgical History:  Procedure Laterality Date  . ABDOMINAL HYSTERECTOMY     TAH  . APPENDECTOMY    . COLONOSCOPY    . COLONOSCOPY WITH PROPOFOL N/A 08/05/2017   Procedure: COLONOSCOPY WITH PROPOFOL;  Surgeon: Manya Silvas, MD;  Location: [facility] ENDOSCOPY;  Service: Endoscopy;  Laterality: N/A;  . FOOT SURGERY  1996    There were no vitals filed for this visit.   Subjective Assessment - 05/30/20 1021    Subjective Pt just had a shot in her R knee this morning so it is a little sore. Pt reports that her dizziness is almost gone and is significantly better than when she came in for the initial evaluation. She has been performing the home Epley maneuver and confirms that it brings on her vertigo. Otherwise no specific questions or concerns.    Pertinent History Pt states she has been having episodes of dizziness since around July 2021. It has been unchanged since it first started. She states that it occurs once to twice per day. Symptoms lasts a few seconds to a few minutes. Pt  reports that she feels like she is "blacking out" but "not losing consciousness." Symptoms occur at night after she gets on her knees to say her bedtime prayers and stands up. History of idiopathic neuropathy in bilateral feet for many years. Per neurology note pt also has some low back pain. Pt is not a great historian and has difficulty describing aggravating and easing factors. Denies history of headaches or migraines. Denies chest pains or palpitations.    Diagnostic tests None reported    Patient Stated Goals Resolve dizziness    Currently in Pain? Yes   R knee soreness, unrelated to current episode              TREATMENT   Canalith Repositioning Treatment Performed Dix-hallpike test which is negative on the left and positive on the R for upbeating R torsional nystagmus which has a 10-15s latency and only lasts for 5-6s. Pt reporting concurrent vertigo. Pt treated with 2 bouts of Epley Maneuver for presumed R posterior canal BPPV. 2 minute holds in each position and retesting between maneuvers. After first maneuver Dix-Hallpike is negative on the R for either vertigo or nystagmus. After second maneuver when sitting up pt has a severe vertigo which lasts for approximately 5s. Retesting is positive for pure horizontal geotrophic nystagmus in the  Dix-Hallpike position. Roll test performed which is positive bilaterally for pure horizontal geotrophic nystagmus. Nystagmus is more severe on the R side as is the vertigo. Pt taken through on bout of the [employer] for presumed R horizontal canalithiasis. Roll test negative afterward. Dix-Hallpike is positive for pure horizontal geotrophic nystagmus with vertigo bilaterally, once again worse on the right. One additional Lempert maneuver performed with patient with 1 minute holds in each position. Will recheck BPPV testing at next visit and treat as appropriate.                        PT Short Term Goals - 05/21/20 1549      PT  SHORT TERM GOAL #1   Title Pt will be independent with HEP in order to decrease dizziness in order to decrease fall risk and improve function at home and work.    Time 4    Period Weeks    Status New    Target Date 06/18/20             PT Long Term Goals - 05/21/20 1634      PT LONG TERM GOAL #1   Title Pt will report no further episodes of dizziness when getting up from her knees after praying at night    Time 8    Period Weeks    Status New    Target Date 07/16/20      PT LONG TERM GOAL #2   Title Pt will decrease DHI score to 0 in order to demonstrate clinically significant reduction in disability    Baseline 05/21/20: 12/100    Time 8    Period Weeks    Status New    Target Date 07/16/20                 Plan - 05/30/20 1022    Clinical Impression Statement Performed Dix-hallpike test which is negative on the left and positive on the R for upbeating R torsional nystagmus which has a 10-15s latency and only lasts for 5-6s. Pt reporting concurrent vertigo. Pt treated with 2 bouts of Epley Maneuver for presumed R posterior canal BPPV. 2 minute holds in each position and retesting between maneuvers. After first maneuver Dix-Hallpike is negative on the R for either vertigo or nystagmus. After second maneuver when sitting up pt has a severe vertigo which lasts for approximately 5s. Retesting is positive for pure horizontal geotrophic nystagmus in the Dix-Hallpike position. Roll test performed which is positive bilaterally for pure horizontal geotrophic nystagmus. Nystagmus is more severe on the R side as is the vertigo. Pt taken through on bout of the [employer] for presumed R horizontal canalithiasis. Roll test negative afterward. Dix-Hallpike is positive for pure horizontal geotrophic nystagmus with vertigo bilaterally, once again worse on the right. One additional Lempert maneuver performed with patient with 1 minute holds in each position. Will recheck BPPV testing at next  visit and treat as appropriate.    Personal Factors and Comorbidities Age;Comorbidity 2    Comorbidities OA, osteoporosis, peripheral neuropathy    Examination-Activity Limitations Locomotion Level    Stability/Clinical Decision Making Stable/Uncomplicated    Rehab Potential Good    PT Frequency 1x / week    PT Duration 8 weeks    PT Treatment/Interventions ADLs/Self Care Home Management;Aquatic Therapy;Biofeedback;Canalith Repostioning;Cryotherapy;Electrical Stimulation;Iontophoresis 4mg /ml Dexamethasone;Moist Heat;Traction;Ultrasound;DME Instruction;Gait training;Stair training;Functional mobility training;Therapeutic activities;Therapeutic exercise;Balance training;Neuromuscular re-education;Patient/family education;Manual techniques;Passive range of motion;Dry needling;Vestibular;Joint Manipulations    PT Next  Visit Plan Retest for BPPV and treat as appropriate, review home Epley    PT Home Exercise Plan Access Code: [email or web address]    Consulted and Agree with Plan of Care Patient           Patient will benefit from skilled therapeutic intervention in order to improve the following deficits and impairments:  Dizziness  Visit Diagnosis: Dizziness and giddiness     Problem List There are no problems to display for this patient.  Phillips Grout PT, DPT, GCS  Chikita Dogan 05/30/2020, 11:12 AM  [place] MAIN [facility] SERVICES [address] [place], [place], [address] Phone: [phone]   Fax:  [phone]  Name: TSERING LEAMAN MRN: [id number] Date of Birth: 11-14-1940

## 2020-06-06 ENCOUNTER — Ambulatory Visit: Payer: Medicare HMO

## 2020-06-06 ENCOUNTER — Other Ambulatory Visit: Payer: Self-pay

## 2020-06-06 DIAGNOSIS — R42 Dizziness and giddiness: Secondary | ICD-10-CM | POA: Diagnosis not present

## 2020-06-06 DIAGNOSIS — M1711 Unilateral primary osteoarthritis, right knee: Secondary | ICD-10-CM | POA: Diagnosis not present

## 2020-06-06 NOTE — Therapy (Signed)
[place] MAIN [facility] SERVICES [address] [place], [place], [address] Phone: [phone]   Fax:  [phone]  Physical Therapy Treatment  Patient Details  Name: Tiffany Walton MRN: [id number] Date of Birth: [birth year] Referring Provider (PT): Dr. Manuella Ghazi   Encounter Date: 06/06/2020   PT End of Session - 06/06/20 1026    Visit Number 3    Number of Visits 9    Date for PT Re-Evaluation 07/16/20    Authorization Type eval: 05/21/20    PT Start Time 1024    PT Stop Time 1055    PT Time Calculation (min) 31 min    Activity Tolerance Patient tolerated treatment well    Behavior During Therapy [facility] for tasks assessed/performed           Past Medical History:  Diagnosis Date  . Allergic state   . Arthritis    osteoporosis  . Asthma   . History of colon polyps   . Hyperlipidemia   . Osteoporosis, post-menopausal   . Peripheral neuropathy, idiopathic     Past Surgical History:  Procedure Laterality Date  . ABDOMINAL HYSTERECTOMY     TAH  . APPENDECTOMY    . COLONOSCOPY    . COLONOSCOPY WITH PROPOFOL N/A 08/05/2017   Procedure: COLONOSCOPY WITH PROPOFOL;  Surgeon: Manya Silvas, MD;  Location: [facility] ENDOSCOPY;  Service: Endoscopy;  Laterality: N/A;  . FOOT SURGERY  1996    There were no vitals filed for this visit.   Subjective Assessment - 06/06/20 1026    Subjective Pt reports that her dizziness is still present but significantly improved from the the initial evaluation. She continues with chronic R knee pain and is scheduled to get another injection today. Otherwise no specific questions or concerns.    Pertinent History Pt states she has been having episodes of dizziness since around July 2021. It has been unchanged since it first started. She states that it occurs once to twice per day. Symptoms lasts a few seconds to a few minutes. Pt reports that she feels like she is "blacking out" but "not losing consciousness." Symptoms  occur at night after she gets on her knees to say her bedtime prayers and stands up. History of idiopathic neuropathy in bilateral feet for many years. Per neurology note pt also has some low back pain. Pt is not a great historian and has difficulty describing aggravating and easing factors. Denies history of headaches or migraines. Denies chest pains or palpitations.    Diagnostic tests None reported    Patient Stated Goals Resolve dizziness    Currently in Pain? Yes   chronic R knee pain, unrelated to current episode              TREATMENT   Canalith Repositioning Treatment On inverted mat table performed Dix-hallpike test which is negative on the left and positive on the R for upbeating R torsional nystagmus which has a 5s latency and only lasts for approximately 10s. Pt reporting concurrent vertigo. Pt treated with 1 bout of Epley Maneuver for presumed R posterior canal BPPV. 2 minute holds in each position and retesting afterward. Performed R sidelying test after first maneuver which is positive for pure horizontal geotrophic nystagmus which is very vigorous. Completed 1 Liberatory maneuver for R side with 1 minute holds in each position. Roll test performed afterward which is positive for bilateral pure horizontal geotrophic nystagmus which is more vigorous on the R side. Pt taken through two  Gufoni maneuvers for R horizontal canalithiasis BPPV (L sidelying for 45s followed by one minute nose down). During second Gufoni maneuver pt has no nystagmus or vertigo in the L sidelying position. Retested pt in R sidelying position, bilateral horizontal roll test, and R Dix-Hallpike and they are all negative for either vertigo or nystagmus. Upon standing pt is slightly more unsteady so assisted pt to the waiting room and volunteer services contacted to bring down a transport chair. Pt and sister advised that pt will need to be extra careful today due to unsteadiness. Symptoms should resolve in 24-48  hours.                                    PT Short Term Goals - 05/21/20 1549      PT SHORT TERM GOAL #1   Title Pt will be independent with HEP in order to decrease dizziness in order to decrease fall risk and improve function at home and work.    Time 4    Period Weeks    Status New    Target Date 06/18/20             PT Long Term Goals - 05/21/20 1634      PT LONG TERM GOAL #1   Title Pt will report no further episodes of dizziness when getting up from her knees after praying at night    Time 8    Period Weeks    Status New    Target Date 07/16/20      PT LONG TERM GOAL #2   Title Pt will decrease DHI score to 0 in order to demonstrate clinically significant reduction in disability    Baseline 05/21/20: 12/100    Time 8    Period Weeks    Status New    Target Date 07/16/20                 Plan - 06/06/20 1027    Clinical Impression Statement On inverted mat table performed Dix-hallpike test which is negative on the left and positive on the R for upbeating R torsional nystagmus which has a 5s latency and only lasts for approximately 10s. Pt reporting concurrent vertigo. Pt treated with 1 bout of Epley Maneuver for presumed R posterior canal BPPV. 2 minute holds in each position and retesting afterward. Performed R sidelying test after first maneuver which is positive for pure horizontal geotrophic nystagmus which is very vigorous. Completed 1 Liberatory maneuver for R side with 1 minute holds in each position. Roll test performed afterward which is positive for bilateral pure horizontal geotrophic nystagmus which is more vigorous on the R side. Pt taken through two Gufoni maneuvers for R horizontal canalithiasis BPPV (L sidelying for 45s followed by one minute nose down). During second Gufoni maneuver pt has no nystagmus or vertigo in the L sidelying position. Retested pt in R sidelying position, bilateral horizontal roll test, and R  Dix-Hallpike and they are all negative for either vertigo or nystagmus. Upon standing pt is slightly more unsteady so assisted pt to the waiting room and volunteer services contacted to bring down a transport chair. Pt and sister advised that pt will need to be extra careful today due to unsteadiness. Symptoms should resolve in 24-48 hours.    Personal Factors and Comorbidities Age;Comorbidity 2    Comorbidities OA, osteoporosis, peripheral neuropathy    Examination-Activity Limitations Locomotion Level  Stability/Clinical Decision Making Stable/Uncomplicated    Rehab Potential Good    PT Frequency 1x / week    PT Duration 8 weeks    PT Treatment/Interventions ADLs/Self Care Home Management;Aquatic Therapy;Biofeedback;Canalith Repostioning;Cryotherapy;Electrical Stimulation;Iontophoresis 4mg /ml Dexamethasone;Moist Heat;Traction;Ultrasound;DME Instruction;Gait training;Stair training;Functional mobility training;Therapeutic activities;Therapeutic exercise;Balance training;Neuromuscular re-education;Patient/family education;Manual techniques;Passive range of motion;Dry needling;Vestibular;Joint Manipulations    PT Next Visit Plan Retest for BPPV and treat as appropriate, review home Epley    PT Home Exercise Plan Access Code: [email or web address]    Consulted and Agree with Plan of Care Patient           Patient will benefit from skilled therapeutic intervention in order to improve the following deficits and impairments:  Dizziness  Visit Diagnosis: Dizziness and giddiness     Problem List There are no problems to display for this patient.  Phillips Grout PT, DPT, GCS  Jamine Highfill 06/06/2020, 11:26 AM  [place] MAIN [facility] SERVICES [address] [place], [place], [address] Phone: [phone]   Fax:  [phone]  Name: Tiffany Walton MRN: [id number] Date of Birth: 1940/10/01

## 2020-06-13 ENCOUNTER — Other Ambulatory Visit: Payer: Self-pay

## 2020-06-13 ENCOUNTER — Ambulatory Visit: Payer: Medicare HMO

## 2020-06-13 DIAGNOSIS — R42 Dizziness and giddiness: Secondary | ICD-10-CM | POA: Diagnosis not present

## 2020-06-13 DIAGNOSIS — M1711 Unilateral primary osteoarthritis, right knee: Secondary | ICD-10-CM | POA: Diagnosis not present

## 2020-06-13 NOTE — Therapy (Signed)
[place] [facility] [address] [place], [place], [address] Phone: [phone]   Fax:  [phone]  Physical Therapy Treatment  Patient Details  Name: Tiffany Walton MRN: [id number] Date of Birth: June 01, 1941 Referring Provider (PT): Dr. Manuella Ghazi   Encounter Date: 06/13/2020   PT End of Session - 06/13/20 1013    Visit Number 4    Number of Visits 9    Date for PT Re-Evaluation 07/16/20    Authorization Type eval: 05/21/20    PT Start Time 1015    PT Stop Time 1100    PT Time Calculation (min) 45 min    Activity Tolerance Patient tolerated treatment well    Behavior During Therapy [facility] for tasks assessed/performed           Past Medical History:  Diagnosis Date   Allergic state    Arthritis    osteoporosis   Asthma    History of colon polyps    Hyperlipidemia    Osteoporosis, post-menopausal    Peripheral neuropathy, idiopathic     Past Surgical History:  Procedure Laterality Date   ABDOMINAL HYSTERECTOMY     TAH   APPENDECTOMY     COLONOSCOPY     COLONOSCOPY WITH PROPOFOL N/A 08/05/2017   Procedure: COLONOSCOPY WITH PROPOFOL;  Surgeon: Manya Silvas, MD;  Location: [facility] ENDOSCOPY;  Service: Endoscopy;  Laterality: N/A;   FOOT SURGERY  1996    There were no vitals filed for this visit.   Subjective Assessment - 06/13/20 1012    Subjective Patient reports she has had only mild vertigo over the course the last week.  She does report that she felt very unsteady and sick after her last therapy session for the first few days.  No specific questions or concerns upon arrival today.    Pertinent History Pt states she has been having episodes of dizziness since around July 2021. It has been unchanged since it first started. She states that it occurs once to twice per day. Symptoms lasts a few seconds to a few minutes. Pt reports that she feels like she is "blacking out" but "not losing consciousness." Symptoms occur at  night after she gets on her knees to say her bedtime prayers and stands up. History of idiopathic neuropathy in bilateral feet for many years. Per neurology note pt also has some low back pain. Pt is not a great historian and has difficulty describing aggravating and easing factors. Denies history of headaches or migraines. Denies chest pains or palpitations.    Diagnostic tests None reported    Patient Stated Goals Resolve dizziness    Currently in Pain? Yes   Chronic R knee pain, unrelated to current episode             TREATMENT   Canalith Repositioning Treatment On inverted mat table performed Dix-hallpike test which is negative on the left and positive on the R for upbeating R torsional nystagmus which has almost a 20s latency before it starts and only lasts for approximately 5s. Pt reporting concurrent vertigo. Pt treated with 1 bout of Epley Maneuver for presumed R posterior canal BPPV. 1 minute holds in each position.  Afterward Performed R sidelying test, bilateral horizontal roll test, and R Dix-Hallpike and they are all negative for either vertigo or nystagmus. Completed second Epley maneuver for the right side with 1 minute holds in each position.  Patient is very close to having full resolution of her BPPV.  Will  retest and treat as appropriate at next session and discharge once fully clear.                         PT Short Term Goals - 05/21/20 1549      PT SHORT TERM GOAL #1   Title Pt will be independent with HEP in order to decrease dizziness in order to decrease fall risk and improve function at home and work.    Time 4    Period Weeks    Status New    Target Date 06/18/20             PT Long Term Goals - 05/21/20 1634      PT LONG TERM GOAL #1   Title Pt will report no further episodes of dizziness when getting up from her knees after praying at night    Time 8    Period Weeks    Status New    Target Date 07/16/20      PT LONG TERM  GOAL #2   Title Pt will decrease DHI score to 0 in order to demonstrate clinically significant reduction in disability    Baseline 05/21/20: 12/100    Time 8    Period Weeks    Status New    Target Date 07/16/20                 Plan - 06/13/20 1013    Clinical Impression Statement On inverted mat table performed Dix-hallpike test which is negative on the left and positive on the R for upbeating R torsional nystagmus which has almost a 20s latency before it starts and only lasts for approximately 5s. Pt reporting concurrent vertigo. Pt treated with 1 bout of Epley Maneuver for presumed R posterior canal BPPV. 1 minute holds in each position.  Afterward Performed R sidelying test, bilateral horizontal roll test, and R Dix-Hallpike and they are all negative for either vertigo or nystagmus. Completed second Epley maneuver for the right side with 1 minute holds in each position.  Patient is very close to having full resolution of her BPPV.  Will retest and treat as appropriate at next session and discharge once fully clear.    Personal Factors and Comorbidities Age;Comorbidity 2    Comorbidities OA, osteoporosis, peripheral neuropathy    Examination-Activity Limitations Locomotion Level    Stability/Clinical Decision Making Stable/Uncomplicated    Rehab Potential Good    PT Frequency 1x / week    PT Duration 8 weeks    PT Treatment/Interventions ADLs/Self Care Home Management;Aquatic Therapy;Biofeedback;Canalith Repostioning;Cryotherapy;Electrical Stimulation;Iontophoresis 4mg /ml Dexamethasone;Moist Heat;Traction;Ultrasound;DME Instruction;Gait training;Stair training;Functional mobility training;Therapeutic activities;Therapeutic exercise;Balance training;Neuromuscular re-education;Patient/family education;Manual techniques;Passive range of motion;Dry needling;Vestibular;Joint Manipulations    PT Next Visit Plan Retest for BPPV and treat as appropriate, review home Epley    PT Home Exercise  Plan Access Code: [email or web address]    Consulted and Agree with Plan of Care Patient           Patient will benefit from skilled therapeutic intervention in order to improve the following deficits and impairments:  Dizziness  Visit Diagnosis: Dizziness and giddiness     Problem List There are no problems to display for this patient.  Lyndel Safe Germaine Ripp PT, DPT, GCS  Henning Ehle 06/13/2020, 10:44 AM  [place] [facility] [address] [place], [place], [address] Phone: [phone]   Fax:  [phone]  Name: Tiffany Walton MRN: [id number] Date of Birth: 1940/10/29

## 2020-06-20 ENCOUNTER — Ambulatory Visit: Payer: Medicare HMO | Attending: Neurology

## 2020-06-20 ENCOUNTER — Other Ambulatory Visit: Payer: Self-pay

## 2020-06-20 DIAGNOSIS — R42 Dizziness and giddiness: Secondary | ICD-10-CM | POA: Insufficient documentation

## 2020-06-20 NOTE — Therapy (Signed)
[place] [facility] [address] [place], [place], [address] Phone: [phone]   Fax:  [phone]  Physical Therapy Treatment  Patient Details  Name: Tiffany Walton MRN: [id number] Date of Birth: 03-12-1941 Referring Provider (PT): Dr. Manuella Ghazi   Encounter Date: 06/20/2020   PT End of Session - 06/20/20 1126    Visit Number 5    Number of Visits 9    Date for PT Re-Evaluation 07/16/20    Authorization Type eval: 05/21/20    PT Start Time 1102    PT Stop Time 1140    PT Time Calculation (min) 38 min    Activity Tolerance Patient tolerated treatment well    Behavior During Therapy [facility] for tasks assessed/performed           Past Medical History:  Diagnosis Date   Allergic state    Arthritis    osteoporosis   Asthma    History of colon polyps    Hyperlipidemia    Osteoporosis, post-menopausal    Peripheral neuropathy, idiopathic     Past Surgical History:  Procedure Laterality Date   ABDOMINAL HYSTERECTOMY     TAH   APPENDECTOMY     COLONOSCOPY     COLONOSCOPY WITH PROPOFOL N/A 08/05/2017   Procedure: COLONOSCOPY WITH PROPOFOL;  Surgeon: Manya Silvas, MD;  [facility] ENDOSCOPY;  Service: Endoscopy;  Laterality: N/A;   FOOT SURGERY  1996    There were no vitals filed for this visit.   Subjective Assessment - 06/20/20 1125    Subjective Patient denies any further episodes of vertigo since her last therapy session. She did not feel unwell after the last session either. She is hopeful that today will be her last treatment. No specific questions or concerns currently.    Pertinent History Pt states she has been having episodes of dizziness since around July 2021. It has been unchanged since it first started. She states that it occurs once to twice per day. Symptoms lasts a few seconds to a few minutes. Pt reports that she feels like she is "blacking out" but "not losing consciousness." Symptoms occur at night  after she gets on her knees to say her bedtime prayers and stands up. History of idiopathic neuropathy in bilateral feet for many years. Per neurology note pt also has some low back pain. Pt is not a great historian and has difficulty describing aggravating and easing factors. Denies history of headaches or migraines. Denies chest pains or palpitations.    Diagnostic tests None reported    Patient Stated Goals Resolve dizziness    Currently in Pain? Other (Comment)   Unrelated to current episode             TREATMENT   Canalith Repositioning Treatment On mildly inverted mat table with pillow at shoulder blades performed Dix-hallpike test which is negative on the left and positive on the R for upbeating R torsional nystagmus which starts immediately and lasts approximately 15s. Pt reporting concurrent vertigo. Pt treated with1bout of Epley Maneuver for presumed R posterior canal BPPV. 2 minute holds in each position.  AfterwardPerformed R sidelying test which is negative. Proceeded with Liberatory maneuver for R side with 2 minute holds in each position. Repeated Dix-Hallpike test which is negative but still completed second Epley maneuver with 2 minute holds in each position. Performed one deep head hang maneuver with 60s holds in head down and chin to chest position. Will retest and treat as appropriate at  next session and discharge once fully clear.                          PT Short Term Goals - 05/21/20 1549      PT SHORT TERM GOAL #1   Title Pt will be independent with HEP in order to decrease dizziness in order to decrease fall risk and improve function at home and work.    Time 4    Period Weeks    Status New    Target Date 06/18/20             PT Long Term Goals - 05/21/20 1634      PT LONG TERM GOAL #1   Title Pt will report no further episodes of dizziness when getting up from her knees after praying at night    Time 8    Period Weeks     Status New    Target Date 07/16/20      PT LONG TERM GOAL #2   Title Pt will decrease DHI score to 0 in order to demonstrate clinically significant reduction in disability    Baseline 05/21/20: 12/100    Time 8    Period Weeks    Status New    Target Date 07/16/20                 Plan - 06/20/20 1125    Clinical Impression Statement On mildly inverted mat table with pillow at shoulder blades performed Dix-hallpike test which is negative on the left and positive on the R for upbeating R torsional nystagmus which starts immediately and lasts approximately 15s. Pt reporting concurrent vertigo. Pt treated with 1 bout of Epley Maneuver for presumed R posterior canal BPPV. 2 minute holds in each position.  Afterward Performed R sidelying test which is negative. Proceeded with Liberatory maneuver for R side with 2 minute holds in each position. Repeated Dix-Hallpike test which is negative but still completed second Epley maneuver with 2 minute holds in each position. Performed one deep head hang maneuver with 60s holds in head down and chin to chest position. Will retest and treat as appropriate at next session and discharge once fully clear.    Personal Factors and Comorbidities Age;Comorbidity 2    Comorbidities OA, osteoporosis, peripheral neuropathy    Examination-Activity Limitations Locomotion Level    Stability/Clinical Decision Making Stable/Uncomplicated    Rehab Potential Good    PT Frequency 1x / week    PT Duration 8 weeks    PT Treatment/Interventions ADLs/Self Care Home Management;Aquatic Therapy;Biofeedback;Canalith Repostioning;Cryotherapy;Electrical Stimulation;Iontophoresis 4mg /ml Dexamethasone;Moist Heat;Traction;Ultrasound;DME Instruction;Gait training;Stair training;Functional mobility training;Therapeutic activities;Therapeutic exercise;Balance training;Neuromuscular re-education;Patient/family education;Manual techniques;Passive range of motion;Dry  needling;Vestibular;Joint Manipulations    PT Next Visit Plan Retest for BPPV and treat as appropriate, review home Epley, sleep on L side (added 06/20/20)    PT Home Exercise Plan Access Code: [email or web address]    Consulted and Agree with Plan of Care Patient           Patient will benefit from skilled therapeutic intervention in order to improve the following deficits and impairments:  Dizziness  Visit Diagnosis: Dizziness and giddiness     Problem List There are no problems to display for this patient.  Lyndel Safe Abdulaziz Toman PT, DPT, GCS  Kainan Patty 06/20/2020, 11:39 AM  [place] [facility] [address] [place], [place], [address] Phone: [phone]   Fax:  [phone]  Name: Aura Camps  [place] MRN: [id number] Date of Birth: Feb 16, 1941

## 2020-06-25 DIAGNOSIS — R0602 Shortness of breath: Secondary | ICD-10-CM | POA: Diagnosis not present

## 2020-06-25 DIAGNOSIS — J4520 Mild intermittent asthma, uncomplicated: Secondary | ICD-10-CM | POA: Diagnosis not present

## 2020-06-25 DIAGNOSIS — I898 Other specified noninfective disorders of lymphatic vessels and lymph nodes: Secondary | ICD-10-CM | POA: Diagnosis not present

## 2020-06-27 ENCOUNTER — Ambulatory Visit: Payer: Medicare HMO

## 2020-06-27 ENCOUNTER — Other Ambulatory Visit: Payer: Self-pay

## 2020-06-27 DIAGNOSIS — R42 Dizziness and giddiness: Secondary | ICD-10-CM | POA: Diagnosis not present

## 2020-06-27 NOTE — Therapy (Signed)
[facility] [address] [place], [place], [address] Phone: [phone]   Fax:  [phone]  Physical Therapy Treatment  Patient Details  Name: Tiffany Walton MRN: [id number] Date of Birth: 08/02/1940 Referring Provider (PT): Dr. Manuella Ghazi   Encounter Date: 06/27/2020   PT End of Session - 06/27/20 1104    Visit Number 6    Number of Visits 9    Date for PT Re-Evaluation 07/16/20    Authorization Type eval: 05/21/20    PT Start Time 1100    PT Stop Time 1130    PT Time Calculation (min) 30 min    Activity Tolerance Patient tolerated treatment well    Behavior During Therapy [facility] for tasks assessed/performed           Past Medical History:  Diagnosis Date  . Allergic state   . Arthritis    osteoporosis  . Asthma   . History of colon polyps   . Hyperlipidemia   . Osteoporosis, post-menopausal   . Peripheral neuropathy, idiopathic     Past Surgical History:  Procedure Laterality Date  . ABDOMINAL HYSTERECTOMY     TAH  . APPENDECTOMY    . COLONOSCOPY    . COLONOSCOPY WITH PROPOFOL N/A 08/05/2017   Procedure: COLONOSCOPY WITH PROPOFOL;  Surgeon: Manya Silvas, MD;  Location: [facility] ENDOSCOPY;  Service: Endoscopy;  Laterality: N/A;  . FOOT SURGERY  1996    There were no vitals filed for this visit.   Subjective Assessment - 06/27/20 1100    Subjective Patient denies any further episodes of vertigo since her last therapy session. She felt unwell for 1-2 hours after her last therapy session and then felt fine. No specific questions or concerns currently.    Pertinent History Pt states she has been having episodes of dizziness since around July 2021. It has been unchanged since it first started. She states that it occurs once to twice per day. Symptoms lasts a few seconds to a few minutes. Pt reports that she feels like she is "blacking out" but "not losing consciousness." Symptoms occur at night after she gets on her knees to  say her bedtime prayers and stands up. History of idiopathic neuropathy in bilateral feet for many years. Per neurology note pt also has some low back pain. Pt is not a great historian and has difficulty describing aggravating and easing factors. Denies history of headaches or migraines. Denies chest pains or palpitations.    Diagnostic tests None reported    Patient Stated Goals Resolve dizziness    Currently in Pain? Other (Comment)   Unrelated to current episode             TREATMENT   Canalith Repositioning Treatment On mildly inverted mat table with pillow at shoulder blades performed Dix-hallpike test which is negative on the left and positive on the R for upbeating R torsional nystagmus after approximately 20s latency which lasts for 10s. Pt reporting concurrent vertigo. Pt treated with1bout of Epley Maneuver for presumed R posterior canal BPPV.28minute holds in each position. Utilized vibratory device on R mastoid to help with movement of otoconia in canal. Pt has a significant bout of vertigo and reversal of nystagmus when sitting up at end of maneuver. AfterwardPerformed R Dix-Hallpike again which is negative for both vertigo and nystagmus. Performed one additional Epley maneuver with one minute holds and also used vibratory device again. Afterward performed roll test which is negative bilaterally. Performed R sidelying test  which is negative and proceeded to complete R sided Liberatory maneuver with 1 minute holds in each position. Pt reports that she has in fact had a couple faint episodes of vertigo after praying at night since last session so performed bow and lean test which is negative.Will retest and treat as appropriate at next session and will likely discharge given resistance to full clearance but significant improvement in her symptoms.                          PT Short Term Goals - 05/21/20 1549      PT SHORT TERM GOAL #1   Title Pt will be  independent with HEP in order to decrease dizziness in order to decrease fall risk and improve function at home and work.    Time 4    Period Weeks    Status New    Target Date 06/18/20             PT Long Term Goals - 05/21/20 1634      PT LONG TERM GOAL #1   Title Pt will report no further episodes of dizziness when getting up from her knees after praying at night    Time 8    Period Weeks    Status New    Target Date 07/16/20      PT LONG TERM GOAL #2   Title Pt will decrease DHI score to 0 in order to demonstrate clinically significant reduction in disability    Baseline 05/21/20: 12/100    Time 8    Period Weeks    Status New    Target Date 07/16/20                 Plan - 06/27/20 1259    Clinical Impression Statement On mildly inverted mat table with pillow at shoulder blades performed Dix-hallpike test which is negative on the left and positive on the R for upbeating R torsional nystagmus after approximately 20s latency which lasts for 10s. Pt reporting concurrent vertigo. Pt treated with 1 bout of Epley Maneuver for presumed R posterior canal BPPV. 2 minute holds in each position. Utilized vibratory device on R mastoid to help with movement of otoconia in canal. Pt has a significant bout of vertigo and reversal of nystagmus when sitting up at end of maneuver. Afterward Performed R Dix-Hallpike again which is negative for both vertigo and nystagmus. Performed one additional Epley maneuver with one minute holds and also used vibratory device again. Afterward performed roll test which is negative bilaterally. Performed R sidelying test which is negative and proceeded to complete R sided Liberatory maneuver with 1 minute holds in each position. Pt reports that she has in fact had a couple faint episodes of vertigo after praying at night since last session so performed bow and lean test which is negative. Will retest and treat as appropriate at next session and will likely  discharge given resistance to full clearance but significant improvement in her symptoms.    Personal Factors and Comorbidities Age;Comorbidity 2    Comorbidities OA, osteoporosis, peripheral neuropathy    Examination-Activity Limitations Locomotion Level    Stability/Clinical Decision Making Stable/Uncomplicated    Rehab Potential Good    PT Frequency 1x / week    PT Duration 8 weeks    PT Treatment/Interventions ADLs/Self Care Home Management;Aquatic Therapy;Biofeedback;Canalith Repostioning;Cryotherapy;Electrical Stimulation;Iontophoresis 4mg /ml Dexamethasone;Moist Heat;Traction;Ultrasound;DME Instruction;Gait training;Stair training;Functional mobility training;Therapeutic activities;Therapeutic exercise;Balance training;Neuromuscular re-education;Patient/family education;Manual techniques;Passive range  of motion;Dry needling;Vestibular;Joint Manipulations    PT Next Visit Plan Retest for BPPV and treat as appropriate, review home Epley, sleep on L side (added 06/20/20)    PT Home Exercise Plan Access Code: [email or web address]    Consulted and Agree with Plan of Care Patient           Patient will benefit from skilled therapeutic intervention in order to improve the following deficits and impairments:  Dizziness  Visit Diagnosis: Dizziness and giddiness     Problem List There are no problems to display for this patient.  Lyndel Safe Anastashia Westerfeld PT, DPT, GCS  Margene Cherian 06/27/2020, 1:06 PM  [facility] [address]. [place], [place], [address] Phone: [phone]   Fax:  [phone]  Name: Tiffany Walton MRN: [id number] Date of Birth: 08-May-1941

## 2020-07-04 ENCOUNTER — Ambulatory Visit: Payer: Medicare HMO

## 2020-07-11 ENCOUNTER — Ambulatory Visit: Payer: Medicare HMO

## 2020-07-11 ENCOUNTER — Other Ambulatory Visit: Payer: Self-pay

## 2020-07-11 DIAGNOSIS — R42 Dizziness and giddiness: Secondary | ICD-10-CM

## 2020-07-11 NOTE — Therapy (Signed)
[facility] [address] [place], [place], [address] Phone: [phone]   Fax:  [phone]  Physical Therapy Treatment  Patient Details  Name: Tiffany Walton MRN: [id number] Date of Birth: 28-Nov-1940  Referring Provider (PT): Dr. Manuella Ghazi   Encounter Date: 07/11/2020   PT End of Session - 07/11/20 1036    Visit Number 7    Number of Visits 9    Date for PT Re-Evaluation 07/16/20    Authorization Type eval: 05/21/20    PT Start Time 1100    PT Stop Time 1130    PT Time Calculation (min) 30 min    Activity Tolerance Patient tolerated treatment well    Behavior During Therapy [facility] for tasks assessed/performed           Past Medical History:  Diagnosis Date  . Allergic state   . Arthritis    osteoporosis  . Asthma   . History of colon polyps   . Hyperlipidemia   . Osteoporosis, post-menopausal   . Peripheral neuropathy, idiopathic     Past Surgical History:  Procedure Laterality Date  . ABDOMINAL HYSTERECTOMY     TAH  . APPENDECTOMY    . COLONOSCOPY    . COLONOSCOPY WITH PROPOFOL N/A 08/05/2017   Procedure: COLONOSCOPY WITH PROPOFOL;  Surgeon: Manya Silvas, MD;  Location[facility] END[facility]COPY;  Service: Endoscopy;  Laterality: N/A;  . FOOT SURGERY  1996    There were no vitals filed for this visit.   Subjective Assessment - 07/11/20 1035    Subjective Patient denies and dizziness upon arrival today. She reports that she has had a few episodes of vertigo since her last therapy session when getting up from kneeling position to pray. She felt unwell for the rest of the day after her last therapy session but by the next day she was fine. No specific questions or concerns currently.    Pertinent History Pt states she has been having episodes of dizziness since around July 2021. It has been unchanged since it first started. She states that it occurs once to twice per day. Symptoms lasts a few seconds to a few minutes. Pt reports  that she feels like she is "blacking out" but "not losing consciousness." Symptoms occur at night after she gets on her knees to say her bedtime prayers and stands up. History of idiopathic neuropathy in bilateral feet for many years. Per neurology note pt also has some low back pain. Pt is not a great historian and has difficulty describing aggravating and easing factors. Denies history of headaches or migraines. Denies chest pains or palpitations.    Diagnostic tests None reported    Patient Stated Goals Resolve dizziness    Currently in Pain? Other (Comment)   Unrelated to current episode            TREATMENT   Canalith Repositioning Treatment On mildly inverted mat table with pillow at shoulder blades performed Dix-hallpike test which is positive on the R for upbeating R torsional nystagmus after approximately 30s latency which lasts for 10s. Pt reporting concurrent vertigo. Performed roll test which is negative bilaterally. Repeated R Dix-Hallpike which is negative. Performed L Dix-Hallpike which is positive for upbeating R torsional nystagmus after 10s latency lasting for approximately 5s with concurrent vertigo. Given persistent resistance to full resolution with R Epley even though nystagmus doesn't support performed L Epley maneuver for 2 bout with 2 minute holds in each position.  Repeated R Dix-Hallpike  test and it is positive for downbeating R torsional nystagmus. L Dix-Hallpike test is positive for very faint upbeating L torsional nystagmus with pt reporting very mild vertigo. Pt taken through one additional bout of L Epley maneuver with 2 minute holds in each position.Will retest and treat as appropriate at next session.                                   PT Short Term Goals - 05/21/20 1549      PT SHORT TERM GOAL #1   Title Pt will be independent with HEP in order to decrease dizziness in order to decrease fall risk and improve function at home and  work.    Time 4    Period Weeks    Status New    Target Date 06/18/20             PT Long Term Goals - 05/21/20 1634      PT LONG TERM GOAL #1   Title Pt will report no further episodes of dizziness when getting up from her knees after praying at night    Time 8    Period Weeks    Status New    Target Date 07/16/20      PT LONG TERM GOAL #2   Title Pt will decrease DHI score to 0 in order to demonstrate clinically significant reduction in disability    Baseline 05/21/20: 12/100    Time 8    Period Weeks    Status New    Target Date 07/16/20                 Plan - 07/11/20 1036    Clinical Impression Statement On mildly inverted mat table with pillow at shoulder blades performed Dix-hallpike test which is positive on the R for upbeating R torsional nystagmus after approximately 30s latency which lasts for 10s. Pt reporting concurrent vertigo. Performed roll test which is negative bilaterally. Repeated R Dix-Hallpike which is negative. Performed L Dix-Hallpike which is positive for upbeating R torsional nystagmus after 10s latency lasting for approximately 5s with concurrent vertigo. Given persistent resistance to full resolution with R Epley even though nystagmus doesn't support performed L Epley maneuver for 2 bout with 2 minute holds in each position.  Repeated R Dix-Hallpike test and it is positive for downbeating R torsional nystagmus. L Dix-Hallpike test is positive for very faint upbeating L torsional nystagmus with pt reporting very mild vertigo. Pt taken through one additional bout of L Epley maneuver with 2 minute holds in each position. Will retest and treat as appropriate at next session.    Personal Factors and Comorbidities Age;Comorbidity 2    Comorbidities OA, osteoporosis, peripheral neuropathy    Examination-Activity Limitations Locomotion Level    Stability/Clinical Decision Making Stable/Uncomplicated    Rehab Potential Good    PT Frequency 1x / week     PT Duration 8 weeks    PT Treatment/Interventions ADLs/Self Care Home Management;Aquatic Therapy;Biofeedback;Canalith Repostioning;Cryotherapy;Electrical Stimulation;Iontophoresis 4mg /ml Dexamethasone;Moist Heat;Traction;Ultrasound;DME Instruction;Gait training;Stair training;Functional mobility training;Therapeutic activities;Therapeutic exercise;Balance training;Neuromuscular re-education;Patient/family education;Manual techniques;Passive range of motion;Dry needling;Vestibular;Joint Manipulations    PT Next Visit Plan Update outcome measures, goals, and recertification, Retest for BPPV and treat as appropriate, review home Epley, sleep on L side (added 06/20/20)    PT Home Exercise Plan Access Code: [email or web address]    Consulted and Agree with Plan of Care Patient  Patient will benefit from skilled therapeutic intervention in order to improve the following deficits and impairments:  Dizziness  Visit Diagnosis: Dizziness and giddiness     Problem List There are no problems to display for this patient.  Tiffany Walton PT, DPT, GCS  Zymier Rodgers 07/11/2020, 11:58 AM  [facility] [address] [place], [place], [address] Phone: [phone]   Fax:  [phone]  Name: GRAINNE KNIGHTS MRN: [id number] Date of Birth: 16-Jun-1941

## 2020-07-18 ENCOUNTER — Ambulatory Visit: Payer: Medicare HMO

## 2020-09-03 DIAGNOSIS — R42 Dizziness and giddiness: Secondary | ICD-10-CM | POA: Diagnosis not present

## 2020-09-03 DIAGNOSIS — G600 Hereditary motor and sensory neuropathy: Secondary | ICD-10-CM | POA: Diagnosis not present

## 2020-09-03 DIAGNOSIS — G3184 Mild cognitive impairment, so stated: Secondary | ICD-10-CM | POA: Diagnosis not present

## 2020-10-29 ENCOUNTER — Other Ambulatory Visit: Payer: Self-pay | Admitting: Family Medicine

## 2020-10-29 DIAGNOSIS — Z1231 Encounter for screening mammogram for malignant neoplasm of breast: Secondary | ICD-10-CM

## 2020-11-20 ENCOUNTER — Other Ambulatory Visit: Payer: Self-pay

## 2020-11-20 ENCOUNTER — Ambulatory Visit
Admission: RE | Admit: 2020-11-20 | Discharge: 2020-11-20 | Disposition: A | Discharge status: Home / Self Care | Payer: Medicare HMO | Source: Ambulatory Visit | Attending: Family Medicine | Admitting: Family Medicine

## 2020-11-20 DIAGNOSIS — Z1231 Encounter for screening mammogram for malignant neoplasm of breast: Secondary | ICD-10-CM | POA: Diagnosis not present

## 2020-11-20 DIAGNOSIS — Z01411 Encounter for gynecological examination (general) (routine) with abnormal findings: Secondary | ICD-10-CM | POA: Diagnosis not present

## 2020-11-20 DIAGNOSIS — Z1211 Encounter for screening for malignant neoplasm of colon: Secondary | ICD-10-CM | POA: Diagnosis not present

## 2020-11-20 DIAGNOSIS — R87622 Low grade squamous intraepithelial lesion on cytologic smear of vagina (LGSIL): Secondary | ICD-10-CM | POA: Diagnosis not present

## 2020-11-20 DIAGNOSIS — Z1272 Encounter for screening for malignant neoplasm of vagina: Secondary | ICD-10-CM | POA: Diagnosis not present

## 2020-11-20 DIAGNOSIS — Z124 Encounter for screening for malignant neoplasm of cervix: Secondary | ICD-10-CM | POA: Diagnosis not present

## 2020-11-20 DIAGNOSIS — N893 Dysplasia of vagina, unspecified: Secondary | ICD-10-CM | POA: Diagnosis not present

## 2020-12-05 DIAGNOSIS — Z1211 Encounter for screening for malignant neoplasm of colon: Secondary | ICD-10-CM | POA: Diagnosis not present

## 2021-01-16 DIAGNOSIS — L600 Ingrowing nail: Secondary | ICD-10-CM | POA: Diagnosis not present

## 2021-01-16 DIAGNOSIS — M778 Other enthesopathies, not elsewhere classified: Secondary | ICD-10-CM | POA: Diagnosis not present

## 2021-01-16 DIAGNOSIS — M79675 Pain in left toe(s): Secondary | ICD-10-CM | POA: Diagnosis not present

## 2021-01-16 DIAGNOSIS — M2041 Other hammer toe(s) (acquired), right foot: Secondary | ICD-10-CM | POA: Diagnosis not present

## 2021-01-16 DIAGNOSIS — M79674 Pain in right toe(s): Secondary | ICD-10-CM | POA: Diagnosis not present

## 2021-01-16 DIAGNOSIS — M25461 Effusion, right knee: Secondary | ICD-10-CM | POA: Diagnosis not present

## 2021-01-16 DIAGNOSIS — B351 Tinea unguium: Secondary | ICD-10-CM | POA: Diagnosis not present

## 2021-01-16 DIAGNOSIS — M2042 Other hammer toe(s) (acquired), left foot: Secondary | ICD-10-CM | POA: Diagnosis not present

## 2021-01-16 DIAGNOSIS — M1711 Unilateral primary osteoarthritis, right knee: Secondary | ICD-10-CM | POA: Diagnosis not present

## 2021-01-28 DIAGNOSIS — M1711 Unilateral primary osteoarthritis, right knee: Secondary | ICD-10-CM | POA: Diagnosis not present

## 2021-03-08 DIAGNOSIS — H25813 Combined forms of age-related cataract, bilateral: Secondary | ICD-10-CM | POA: Diagnosis not present

## 2021-03-08 DIAGNOSIS — H35373 Puckering of macula, bilateral: Secondary | ICD-10-CM | POA: Diagnosis not present

## 2021-03-08 DIAGNOSIS — H524 Presbyopia: Secondary | ICD-10-CM | POA: Diagnosis not present

## 2021-05-20 DIAGNOSIS — R42 Dizziness and giddiness: Secondary | ICD-10-CM | POA: Diagnosis not present

## 2021-05-20 DIAGNOSIS — G609 Hereditary and idiopathic neuropathy, unspecified: Secondary | ICD-10-CM | POA: Diagnosis not present

## 2021-05-20 DIAGNOSIS — G3184 Mild cognitive impairment, so stated: Secondary | ICD-10-CM | POA: Diagnosis not present

## 2021-06-17 DIAGNOSIS — M1711 Unilateral primary osteoarthritis, right knee: Secondary | ICD-10-CM | POA: Diagnosis not present

## 2021-06-17 DIAGNOSIS — M25461 Effusion, right knee: Secondary | ICD-10-CM | POA: Diagnosis not present

## 2022-01-27 DIAGNOSIS — M25461 Effusion, right knee: Secondary | ICD-10-CM | POA: Diagnosis not present

## 2022-01-27 DIAGNOSIS — M1711 Unilateral primary osteoarthritis, right knee: Secondary | ICD-10-CM | POA: Diagnosis not present

## 2022-03-13 DIAGNOSIS — H43812 Vitreous degeneration, left eye: Secondary | ICD-10-CM | POA: Diagnosis not present

## 2022-03-13 DIAGNOSIS — H25813 Combined forms of age-related cataract, bilateral: Secondary | ICD-10-CM | POA: Diagnosis not present

## 2022-03-13 DIAGNOSIS — H524 Presbyopia: Secondary | ICD-10-CM | POA: Diagnosis not present

## 2022-03-13 DIAGNOSIS — H35373 Puckering of macula, bilateral: Secondary | ICD-10-CM | POA: Diagnosis not present

## 2022-03-13 DIAGNOSIS — D3102 Benign neoplasm of left conjunctiva: Secondary | ICD-10-CM | POA: Diagnosis not present

## 2022-03-13 DIAGNOSIS — H35363 Drusen (degenerative) of macula, bilateral: Secondary | ICD-10-CM | POA: Diagnosis not present

## 2022-04-14 DIAGNOSIS — E785 Hyperlipidemia, unspecified: Secondary | ICD-10-CM | POA: Diagnosis not present

## 2022-04-14 DIAGNOSIS — Z1331 Encounter for screening for depression: Secondary | ICD-10-CM | POA: Diagnosis not present

## 2022-04-14 DIAGNOSIS — Z0000 Encounter for general adult medical examination without abnormal findings: Secondary | ICD-10-CM | POA: Diagnosis not present

## 2022-04-14 DIAGNOSIS — R413 Other amnesia: Secondary | ICD-10-CM | POA: Diagnosis not present

## 2022-04-14 DIAGNOSIS — E119 Type 2 diabetes mellitus without complications: Secondary | ICD-10-CM | POA: Diagnosis not present

## 2022-04-14 DIAGNOSIS — J4520 Mild intermittent asthma, uncomplicated: Secondary | ICD-10-CM | POA: Diagnosis not present

## 2022-05-12 DIAGNOSIS — G609 Hereditary and idiopathic neuropathy, unspecified: Secondary | ICD-10-CM | POA: Diagnosis not present

## 2022-05-12 DIAGNOSIS — G3184 Mild cognitive impairment, so stated: Secondary | ICD-10-CM | POA: Diagnosis not present

## 2022-05-12 DIAGNOSIS — R42 Dizziness and giddiness: Secondary | ICD-10-CM | POA: Diagnosis not present

## 2022-05-12 DIAGNOSIS — F341 Dysthymic disorder: Secondary | ICD-10-CM | POA: Diagnosis not present

## 2022-05-12 DIAGNOSIS — J4520 Mild intermittent asthma, uncomplicated: Secondary | ICD-10-CM | POA: Diagnosis not present

## 2022-06-17 DIAGNOSIS — I509 Heart failure, unspecified: Secondary | ICD-10-CM | POA: Diagnosis not present

## 2022-06-17 DIAGNOSIS — M7989 Other specified soft tissue disorders: Secondary | ICD-10-CM | POA: Diagnosis not present

## 2022-06-17 DIAGNOSIS — R0602 Shortness of breath: Secondary | ICD-10-CM | POA: Diagnosis not present

## 2022-07-28 DIAGNOSIS — R0602 Shortness of breath: Secondary | ICD-10-CM | POA: Diagnosis not present

## 2022-07-28 DIAGNOSIS — R609 Edema, unspecified: Secondary | ICD-10-CM | POA: Diagnosis not present

## 2022-08-11 DIAGNOSIS — R0602 Shortness of breath: Secondary | ICD-10-CM | POA: Diagnosis not present

## 2022-09-22 DIAGNOSIS — E785 Hyperlipidemia, unspecified: Secondary | ICD-10-CM | POA: Diagnosis not present

## 2022-09-22 DIAGNOSIS — I5042 Chronic combined systolic (congestive) and diastolic (congestive) heart failure: Secondary | ICD-10-CM | POA: Diagnosis not present

## 2022-09-22 DIAGNOSIS — I429 Cardiomyopathy, unspecified: Secondary | ICD-10-CM | POA: Diagnosis not present

## 2022-09-22 DIAGNOSIS — J4520 Mild intermittent asthma, uncomplicated: Secondary | ICD-10-CM | POA: Diagnosis not present

## 2022-09-22 DIAGNOSIS — R609 Edema, unspecified: Secondary | ICD-10-CM | POA: Diagnosis not present

## 2022-09-22 DIAGNOSIS — E119 Type 2 diabetes mellitus without complications: Secondary | ICD-10-CM | POA: Diagnosis not present

## 2022-11-10 ENCOUNTER — Emergency Department: Payer: Medicare HMO

## 2022-11-10 ENCOUNTER — Other Ambulatory Visit: Payer: Self-pay

## 2022-11-10 ENCOUNTER — Emergency Department
Admission: EM | Admit: 2022-11-10 | Discharge: 2022-11-10 | Disposition: A | Discharge status: Home / Self Care | Payer: Medicare HMO | Attending: Emergency Medicine | Admitting: Emergency Medicine

## 2022-11-10 DIAGNOSIS — R079 Chest pain, unspecified: Secondary | ICD-10-CM | POA: Diagnosis not present

## 2022-11-10 DIAGNOSIS — J45909 Unspecified asthma, uncomplicated: Secondary | ICD-10-CM | POA: Diagnosis not present

## 2022-11-10 DIAGNOSIS — I82451 Acute embolism and thrombosis of right peroneal vein: Secondary | ICD-10-CM | POA: Diagnosis not present

## 2022-11-10 DIAGNOSIS — I509 Heart failure, unspecified: Secondary | ICD-10-CM | POA: Diagnosis not present

## 2022-11-10 DIAGNOSIS — M7989 Other specified soft tissue disorders: Secondary | ICD-10-CM | POA: Diagnosis not present

## 2022-11-10 DIAGNOSIS — R0602 Shortness of breath: Secondary | ICD-10-CM | POA: Diagnosis not present

## 2022-11-10 DIAGNOSIS — J9811 Atelectasis: Secondary | ICD-10-CM | POA: Diagnosis not present

## 2022-11-10 LAB — CBC
HCT: 37.7 % (ref 36.0–46.0)
Hemoglobin: 12 g/dL (ref 12.0–15.0)
MCH: 33 pg (ref 26.0–34.0)
MCHC: 31.8 g/dL (ref 30.0–36.0)
MCV: 103.6 fL — ABNORMAL HIGH (ref 80.0–100.0)
Platelets: 185 10*3/uL (ref 150–400)
RBC: 3.64 MIL/uL — ABNORMAL LOW (ref 3.87–5.11)
RDW: 15.1 % (ref 11.5–15.5)
WBC: 4.3 10*3/uL (ref 4.0–10.5)
nRBC: 0 % (ref 0.0–0.2)

## 2022-11-10 LAB — BASIC METABOLIC PANEL
Anion gap: 9 (ref 5–15)
BUN: 14 mg/dL (ref 8–23)
CO2: 27 mmol/L (ref 22–32)
Calcium: 8.8 mg/dL — ABNORMAL LOW (ref 8.9–10.3)
Chloride: 105 mmol/L (ref 98–111)
Creatinine, Ser: 0.86 mg/dL (ref 0.44–1.00)
GFR, Estimated: >60 mL/min (ref >60)
Glucose, Bld: 134 mg/dL — ABNORMAL HIGH (ref 70–99)
Potassium: 3.4 mmol/L — ABNORMAL LOW (ref 3.5–5.1)
Sodium: 141 mmol/L (ref 135–145)

## 2022-11-10 MED ORDER — APIXABAN (ELIQUIS) VTE STARTER PACK (10MG AND 5MG): ORAL_TABLET | ORAL | 0 refills | Status: DC

## 2022-11-10 MED ORDER — IPRATROPIUM-ALBUTEROL 0.5-2.5 (3) MG/3ML IN SOLN
3.0000 mL | Freq: Once | RESPIRATORY_TRACT | Status: AC
  Administered 2022-11-10: 3 mL via RESPIRATORY_TRACT
  Filled 2022-11-10: qty 3

## 2022-11-10 MED ORDER — ALBUTEROL SULFATE HFA 108 (90 BASE) MCG/ACT IN AERS: 2.0000 | INHALATION_SPRAY | Freq: Four times a day (QID) | RESPIRATORY_TRACT | 0 refills | Status: AC | PRN

## 2022-11-10 MED ORDER — IOHEXOL 350 MG/ML SOLN
75.0000 mL | Freq: Once | INTRAVENOUS | Status: AC | PRN
  Administered 2022-11-10: 75 mL via INTRAVENOUS

## 2022-11-10 NOTE — ED Provider Notes (Signed)
Procedures     ----------------------------------------- 6:02 PM on 11/10/2022 -----------------------------------------  Ultrasound right leg interpreted by me, does show occlusive DVT of the right peroneal vein, infrapopliteal.  Discussed with radiology, and the report reviewed.  CT angio chest negative for PE or other concerning findings.  Patient is feeling much better, eager to be discharged.  She has not had any recent surgery, no head injuries or brain surgeries in the past.  No history of GI bleeding.  No history of allergy to 10 A inhibitors.  No trouble with gait or balance, does not experience frequent falls.  She is a good candidate for anticoagulation and outpatient management.  She will call her PCP Dr. Burnett Sheng tomorrow.Sharman Cheek, MD 11/10/22 [phone]

## 2022-11-10 NOTE — ED Notes (Signed)
Pt transported to Xray via stretcher

## 2022-11-10 NOTE — ED Triage Notes (Signed)
Pt here with SOB since Sat. Pt denies pain. Pt states she has a hx os asthma as a child and was just struggling to catch her breath over the weekend. Pt denies NVD or a cough.

## 2022-11-10 NOTE — ED Provider Notes (Signed)
[facility] Provider Note    Event Date/Time   First MD Initiated Contact with Patient 11/10/22 1152     (approximate)   History   Shortness of Breath   HPI  Tiffany Walton is a 82 y.o. female with a history of CHF, cardiomyopathy, asthma who presents with complaints of shortness of breath.  Patient reports her chest felt tight this morning she felt short of breath.  She did use an inhaler that has helped significantly.  She is feeling improved.  She also reports that she is having some swelling in her right lower extremity for the last month.     Physical Exam   Triage Vital Signs: ED Triage Vitals [11/10/22 1136]  Enc Vitals Group     BP 136/68     Pulse Rate (!) 56     Resp 18     Temp 98.3 F (36.8 C)     Temp Source Oral     SpO2 96 %     Weight 62.1 kg (136 lb 14.5 oz)     Height 1.676 m ( )     Head Circumference      Peak Flow      Pain Score 0     Pain Loc      Pain Edu?      Excl. in GC?     Most recent vital signs: Vitals:   11/10/22 1136  BP: 136/68  Pulse: (!) 56  Resp: 18  Temp: 98.3 F (36.8 C)  SpO2: 96%     General: Awake, no distress.  CV:  Good peripheral perfusion.  Resp:  Normal effort.  Clear to auscultation bilaterally Abd:  No distention.  Other:  Mild swelling to the right lower leg, warm and well-perfused distally   ED Results / Procedures / Treatments   Labs (all labs ordered are listed, but only abnormal results are displayed) Labs Reviewed  CBC - Abnormal; Notable for the following components:      Result Value   RBC 3.64 (*)    MCV 103.6 (*)    All other components within normal limits  BASIC METABOLIC PANEL - Abnormal; Notable for the following components:   Potassium 3.4 (*)    Glucose, Bld 134 (*)    Calcium 8.8 (*)    All other components within normal limits     EKG  ED ECG REPORT I, Jene Every, the attending physician, personally viewed and interpreted this  ECG.  Date: 11/10/2022  Rhythm: normal sinus rhythm QRS Axis: normal Intervals: normal ST/T Wave abnormalities: normal Narrative Interpretation: no evidence of acute ischemia    RADIOLOGY Chest x-ray viewed nterpreted me, no acute abnormality    PROCEDURES:  Critical Care performed:   Procedures   MEDICATIONS ORDERED IN ED: Medications  ipratropium-albuterol (DUONEB) 0.5-2.5 (3) MG/3ML nebulizer solution 3 mL (3 mLs Nebulization Given 11/10/22 1219)  iohexol (OMNIPAQUE) 350 MG/ML injection 75 mL (75 mLs Intravenous Contrast Given 11/10/22 1614)     IMPRESSION / MDM / ASSESSMENT AND PLAN / ED COURSE  I reviewed the triage vital signs and the nursing notes. Patient's presentation is most consistent with acute presentation with potential threat to life or bodily function.  Patient presents with shortness of breath as detailed above, differential includes CHF, asthma, less likely PE, she has no chest pain or pleurisy  Treated with a DuoNeb with complete resolution of symptoms, lab work reassuring, chest x-ray unremarkable.  Will send for ultrasound  of the right lower extremity to evaluate for possible DVT  I have asked my colleague to follow-up on ultrasound and consider CT scan if positive      FINAL CLINICAL IMPRESSION(S) / ED DIAGNOSES   Final diagnoses:  SOB (shortness of breath)  Acute deep vein thrombosis (DVT) of right peroneal vein     Rx / DC Orders   ED Discharge Orders          Ordered    APIXABAN (ELIQUIS) VTE STARTER PACK (  AND )        11/10/22 1801    albuterol (VENTOLIN HFA) 108 (90 Base) MCG/ACT inhaler  Every 6 hours PRN        11/10/22 1811             Note:  This document was prepared using Dragon voice recognition software and may include unintentional dictation errors.   Jene Every, MD 11/11/22 1055

## 2022-11-14 ENCOUNTER — Telehealth: Payer: Self-pay | Admitting: *Deleted

## 2022-11-14 NOTE — Telephone Encounter (Signed)
        Patient  visited [facility] on 11/10/2022  for treatment    Telephone encounter attempt :  1st  Unworking number    Yehuda Mao Greenauer -[facility] , Population Health [phone] [address] [place] , [place] [address] Email : [email or web address]

## 2022-11-27 DIAGNOSIS — M2011 Hallux valgus (acquired), right foot: Secondary | ICD-10-CM | POA: Diagnosis not present

## 2022-11-27 DIAGNOSIS — M25871 Other specified joint disorders, right ankle and foot: Secondary | ICD-10-CM | POA: Diagnosis not present

## 2022-11-27 DIAGNOSIS — B351 Tinea unguium: Secondary | ICD-10-CM | POA: Diagnosis not present

## 2022-11-27 DIAGNOSIS — M778 Other enthesopathies, not elsewhere classified: Secondary | ICD-10-CM | POA: Diagnosis not present

## 2023-01-12 DIAGNOSIS — R0602 Shortness of breath: Secondary | ICD-10-CM | POA: Diagnosis not present

## 2023-01-12 DIAGNOSIS — I82401 Acute embolism and thrombosis of unspecified deep veins of right lower extremity: Secondary | ICD-10-CM | POA: Diagnosis not present

## 2023-01-12 DIAGNOSIS — J4489 Other specified chronic obstructive pulmonary disease: Secondary | ICD-10-CM | POA: Diagnosis not present

## 2023-01-12 DIAGNOSIS — I429 Cardiomyopathy, unspecified: Secondary | ICD-10-CM | POA: Diagnosis not present

## 2023-03-17 DIAGNOSIS — H35363 Drusen (degenerative) of macula, bilateral: Secondary | ICD-10-CM | POA: Diagnosis not present

## 2023-03-17 DIAGNOSIS — H43812 Vitreous degeneration, left eye: Secondary | ICD-10-CM | POA: Diagnosis not present

## 2023-03-17 DIAGNOSIS — H25813 Combined forms of age-related cataract, bilateral: Secondary | ICD-10-CM | POA: Diagnosis not present

## 2023-03-17 DIAGNOSIS — H35373 Puckering of macula, bilateral: Secondary | ICD-10-CM | POA: Diagnosis not present

## 2023-03-17 DIAGNOSIS — D3102 Benign neoplasm of left conjunctiva: Secondary | ICD-10-CM | POA: Diagnosis not present

## 2023-03-30 ENCOUNTER — Other Ambulatory Visit
Admission: RE | Admit: 2023-03-30 | Discharge: 2023-03-30 | Disposition: A | Discharge status: Home / Self Care | Payer: Medicare HMO | Source: Ambulatory Visit | Attending: Internal Medicine | Admitting: Internal Medicine

## 2023-03-30 DIAGNOSIS — E785 Hyperlipidemia, unspecified: Secondary | ICD-10-CM | POA: Diagnosis not present

## 2023-03-30 DIAGNOSIS — E119 Type 2 diabetes mellitus without complications: Secondary | ICD-10-CM | POA: Diagnosis not present

## 2023-03-30 DIAGNOSIS — I5042 Chronic combined systolic (congestive) and diastolic (congestive) heart failure: Secondary | ICD-10-CM | POA: Diagnosis not present

## 2023-03-30 DIAGNOSIS — R0602 Shortness of breath: Secondary | ICD-10-CM | POA: Diagnosis not present

## 2023-03-30 DIAGNOSIS — R600 Localized edema: Secondary | ICD-10-CM | POA: Diagnosis not present

## 2023-03-30 DIAGNOSIS — I5043 Acute on chronic combined systolic (congestive) and diastolic (congestive) heart failure: Secondary | ICD-10-CM | POA: Diagnosis not present

## 2023-03-30 DIAGNOSIS — I429 Cardiomyopathy, unspecified: Secondary | ICD-10-CM | POA: Diagnosis not present

## 2023-03-30 DIAGNOSIS — J4520 Mild intermittent asthma, uncomplicated: Secondary | ICD-10-CM | POA: Diagnosis not present

## 2023-03-30 LAB — BRAIN NATRIURETIC PEPTIDE: B Natriuretic Peptide: 1038.4 pg/mL — ABNORMAL HIGH (ref 0.0–100.0)

## 2023-04-10 DIAGNOSIS — D3102 Benign neoplasm of left conjunctiva: Secondary | ICD-10-CM | POA: Diagnosis not present

## 2023-04-10 DIAGNOSIS — H43813 Vitreous degeneration, bilateral: Secondary | ICD-10-CM | POA: Diagnosis not present

## 2023-04-10 DIAGNOSIS — H25811 Combined forms of age-related cataract, right eye: Secondary | ICD-10-CM | POA: Diagnosis not present

## 2023-04-10 DIAGNOSIS — H25812 Combined forms of age-related cataract, left eye: Secondary | ICD-10-CM | POA: Diagnosis not present

## 2023-04-20 DIAGNOSIS — J4520 Mild intermittent asthma, uncomplicated: Secondary | ICD-10-CM | POA: Diagnosis not present

## 2023-04-20 DIAGNOSIS — Z0000 Encounter for general adult medical examination without abnormal findings: Secondary | ICD-10-CM | POA: Diagnosis not present

## 2023-04-20 DIAGNOSIS — G3184 Mild cognitive impairment, so stated: Secondary | ICD-10-CM | POA: Diagnosis not present

## 2023-04-20 DIAGNOSIS — E785 Hyperlipidemia, unspecified: Secondary | ICD-10-CM | POA: Diagnosis not present

## 2023-04-20 DIAGNOSIS — M810 Age-related osteoporosis without current pathological fracture: Secondary | ICD-10-CM | POA: Diagnosis not present

## 2023-04-20 DIAGNOSIS — E119 Type 2 diabetes mellitus without complications: Secondary | ICD-10-CM | POA: Diagnosis not present

## 2023-04-21 DIAGNOSIS — R0602 Shortness of breath: Secondary | ICD-10-CM | POA: Diagnosis not present

## 2023-04-21 DIAGNOSIS — I38 Endocarditis, valve unspecified: Secondary | ICD-10-CM | POA: Diagnosis not present

## 2023-04-21 DIAGNOSIS — I5022 Chronic systolic (congestive) heart failure: Secondary | ICD-10-CM | POA: Diagnosis not present

## 2023-05-13 DIAGNOSIS — H25012 Cortical age-related cataract, left eye: Secondary | ICD-10-CM | POA: Diagnosis not present

## 2023-05-13 DIAGNOSIS — H25812 Combined forms of age-related cataract, left eye: Secondary | ICD-10-CM | POA: Diagnosis not present

## 2023-05-22 DIAGNOSIS — H25811 Combined forms of age-related cataract, right eye: Secondary | ICD-10-CM | POA: Diagnosis not present

## 2023-05-22 DIAGNOSIS — Z961 Presence of intraocular lens: Secondary | ICD-10-CM | POA: Diagnosis not present

## 2023-05-27 DIAGNOSIS — H25811 Combined forms of age-related cataract, right eye: Secondary | ICD-10-CM | POA: Diagnosis not present

## 2023-05-27 DIAGNOSIS — H25011 Cortical age-related cataract, right eye: Secondary | ICD-10-CM | POA: Diagnosis not present

## 2023-06-02 DIAGNOSIS — I5022 Chronic systolic (congestive) heart failure: Secondary | ICD-10-CM | POA: Diagnosis not present

## 2023-06-02 DIAGNOSIS — I38 Endocarditis, valve unspecified: Secondary | ICD-10-CM | POA: Diagnosis not present

## 2023-06-02 DIAGNOSIS — I5020 Unspecified systolic (congestive) heart failure: Secondary | ICD-10-CM | POA: Diagnosis not present

## 2023-06-26 DIAGNOSIS — Z0100 Encounter for examination of eyes and vision without abnormal findings: Secondary | ICD-10-CM | POA: Diagnosis not present

## 2023-07-20 ENCOUNTER — Other Ambulatory Visit
Admission: RE | Admit: 2023-07-20 | Discharge: 2023-07-20 | Disposition: A | Discharge status: Home / Self Care | Payer: Medicare HMO | Source: Ambulatory Visit | Attending: Internal Medicine | Admitting: Internal Medicine

## 2023-07-20 DIAGNOSIS — R0602 Shortness of breath: Secondary | ICD-10-CM | POA: Diagnosis not present

## 2023-07-20 DIAGNOSIS — I517 Cardiomegaly: Secondary | ICD-10-CM | POA: Diagnosis not present

## 2023-07-20 DIAGNOSIS — R601 Generalized edema: Secondary | ICD-10-CM | POA: Diagnosis not present

## 2023-07-20 DIAGNOSIS — I509 Heart failure, unspecified: Secondary | ICD-10-CM | POA: Diagnosis not present

## 2023-07-20 LAB — BRAIN NATRIURETIC PEPTIDE: B Natriuretic Peptide: 1912.9 pg/mL — ABNORMAL HIGH (ref 0.0–100.0)

## 2023-09-08 DIAGNOSIS — I38 Endocarditis, valve unspecified: Secondary | ICD-10-CM | POA: Diagnosis not present

## 2023-09-08 DIAGNOSIS — I5022 Chronic systolic (congestive) heart failure: Secondary | ICD-10-CM | POA: Diagnosis not present

## 2023-09-28 DIAGNOSIS — M25561 Pain in right knee: Secondary | ICD-10-CM | POA: Diagnosis not present

## 2023-09-28 DIAGNOSIS — G8929 Other chronic pain: Secondary | ICD-10-CM | POA: Diagnosis not present

## 2023-09-28 DIAGNOSIS — R600 Localized edema: Secondary | ICD-10-CM | POA: Diagnosis not present

## 2023-09-28 DIAGNOSIS — I5022 Chronic systolic (congestive) heart failure: Secondary | ICD-10-CM | POA: Diagnosis not present

## 2023-09-28 DIAGNOSIS — E119 Type 2 diabetes mellitus without complications: Secondary | ICD-10-CM | POA: Diagnosis not present

## 2023-09-28 DIAGNOSIS — M25461 Effusion, right knee: Secondary | ICD-10-CM | POA: Diagnosis not present

## 2023-09-28 DIAGNOSIS — I5020 Unspecified systolic (congestive) heart failure: Secondary | ICD-10-CM | POA: Diagnosis not present

## 2023-09-28 DIAGNOSIS — R0602 Shortness of breath: Secondary | ICD-10-CM | POA: Diagnosis not present

## 2023-09-28 DIAGNOSIS — I38 Endocarditis, valve unspecified: Secondary | ICD-10-CM | POA: Diagnosis not present

## 2023-09-28 DIAGNOSIS — M1711 Unilateral primary osteoarthritis, right knee: Secondary | ICD-10-CM | POA: Diagnosis not present

## 2023-09-28 DIAGNOSIS — E785 Hyperlipidemia, unspecified: Secondary | ICD-10-CM | POA: Diagnosis not present

## 2023-09-28 DIAGNOSIS — J4520 Mild intermittent asthma, uncomplicated: Secondary | ICD-10-CM | POA: Diagnosis not present

## 2023-09-28 DIAGNOSIS — I429 Cardiomyopathy, unspecified: Secondary | ICD-10-CM | POA: Diagnosis not present

## 2023-09-28 NOTE — Progress Notes (Signed)
 Large Joint Injection: R knee  Date/Time: 09/28/2023 8:56 AM  Performed by: Kip Lynwood Double, PA Authorized by: Kip Lynwood Double, [place]   Location:  Knee Site:  R knee Topical skin anesthesia: obtained using ethyl chloride spray   Medications:  3 mL lidocaine  1 %; 8 mL BUPivacaine  HCl 0.5 %; 80 mg triamcinolone  acetonide 40 mg/mL Aspirate amount (mL):  40

## 2023-11-30 DIAGNOSIS — M545 Low back pain, unspecified: Secondary | ICD-10-CM | POA: Diagnosis not present

## 2023-12-10 DIAGNOSIS — M545 Low back pain, unspecified: Secondary | ICD-10-CM | POA: Diagnosis not present

## 2023-12-16 DIAGNOSIS — M545 Low back pain, unspecified: Secondary | ICD-10-CM | POA: Diagnosis not present

## 2023-12-18 DIAGNOSIS — M545 Low back pain, unspecified: Secondary | ICD-10-CM | POA: Diagnosis not present

## 2023-12-23 DIAGNOSIS — M545 Low back pain, unspecified: Secondary | ICD-10-CM | POA: Diagnosis not present

## 2023-12-25 DIAGNOSIS — M545 Low back pain, unspecified: Secondary | ICD-10-CM | POA: Diagnosis not present

## 2023-12-30 DIAGNOSIS — M545 Low back pain, unspecified: Secondary | ICD-10-CM | POA: Diagnosis not present

## 2024-01-01 DIAGNOSIS — M545 Low back pain, unspecified: Secondary | ICD-10-CM | POA: Diagnosis not present

## 2024-01-06 DIAGNOSIS — M545 Low back pain, unspecified: Secondary | ICD-10-CM | POA: Diagnosis not present

## 2024-01-11 DIAGNOSIS — E785 Hyperlipidemia, unspecified: Secondary | ICD-10-CM | POA: Diagnosis not present

## 2024-01-11 DIAGNOSIS — R600 Localized edema: Secondary | ICD-10-CM | POA: Diagnosis not present

## 2024-01-11 DIAGNOSIS — E119 Type 2 diabetes mellitus without complications: Secondary | ICD-10-CM | POA: Diagnosis not present

## 2024-01-11 DIAGNOSIS — I429 Cardiomyopathy, unspecified: Secondary | ICD-10-CM | POA: Diagnosis not present

## 2024-01-11 DIAGNOSIS — I38 Endocarditis, valve unspecified: Secondary | ICD-10-CM | POA: Diagnosis not present

## 2024-01-11 DIAGNOSIS — J4520 Mild intermittent asthma, uncomplicated: Secondary | ICD-10-CM | POA: Diagnosis not present

## 2024-01-11 DIAGNOSIS — R0602 Shortness of breath: Secondary | ICD-10-CM | POA: Diagnosis not present

## 2024-01-11 DIAGNOSIS — I5042 Chronic combined systolic (congestive) and diastolic (congestive) heart failure: Secondary | ICD-10-CM | POA: Diagnosis not present

## 2024-01-15 DIAGNOSIS — M545 Low back pain, unspecified: Secondary | ICD-10-CM | POA: Diagnosis not present

## 2024-01-20 DIAGNOSIS — M545 Low back pain, unspecified: Secondary | ICD-10-CM | POA: Diagnosis not present

## 2024-01-27 DIAGNOSIS — M545 Low back pain, unspecified: Secondary | ICD-10-CM | POA: Diagnosis not present

## 2024-01-29 DIAGNOSIS — M545 Low back pain, unspecified: Secondary | ICD-10-CM | POA: Diagnosis not present

## 2024-02-01 DIAGNOSIS — J4520 Mild intermittent asthma, uncomplicated: Secondary | ICD-10-CM | POA: Diagnosis not present

## 2024-02-01 DIAGNOSIS — E785 Hyperlipidemia, unspecified: Secondary | ICD-10-CM | POA: Diagnosis not present

## 2024-02-01 DIAGNOSIS — R0602 Shortness of breath: Secondary | ICD-10-CM | POA: Diagnosis not present

## 2024-02-01 DIAGNOSIS — E119 Type 2 diabetes mellitus without complications: Secondary | ICD-10-CM | POA: Diagnosis not present

## 2024-02-01 DIAGNOSIS — I429 Cardiomyopathy, unspecified: Secondary | ICD-10-CM | POA: Diagnosis not present

## 2024-02-01 DIAGNOSIS — I5042 Chronic combined systolic (congestive) and diastolic (congestive) heart failure: Secondary | ICD-10-CM | POA: Diagnosis not present

## 2024-02-01 DIAGNOSIS — R600 Localized edema: Secondary | ICD-10-CM | POA: Diagnosis not present

## 2024-02-02 DIAGNOSIS — M545 Low back pain, unspecified: Secondary | ICD-10-CM | POA: Diagnosis not present

## 2024-02-10 DIAGNOSIS — M545 Low back pain, unspecified: Secondary | ICD-10-CM | POA: Diagnosis not present

## 2024-02-12 DIAGNOSIS — M545 Low back pain, unspecified: Secondary | ICD-10-CM | POA: Diagnosis not present

## 2024-02-17 DIAGNOSIS — M545 Low back pain, unspecified: Secondary | ICD-10-CM | POA: Diagnosis not present

## 2024-02-19 DIAGNOSIS — M545 Low back pain, unspecified: Secondary | ICD-10-CM | POA: Diagnosis not present

## 2024-02-24 DIAGNOSIS — M545 Low back pain, unspecified: Secondary | ICD-10-CM | POA: Diagnosis not present

## 2024-02-26 DIAGNOSIS — M545 Low back pain, unspecified: Secondary | ICD-10-CM | POA: Diagnosis not present

## 2024-03-01 DIAGNOSIS — I5042 Chronic combined systolic (congestive) and diastolic (congestive) heart failure: Secondary | ICD-10-CM | POA: Diagnosis not present

## 2024-03-01 DIAGNOSIS — R0609 Other forms of dyspnea: Secondary | ICD-10-CM | POA: Diagnosis not present

## 2024-03-01 DIAGNOSIS — J4489 Other specified chronic obstructive pulmonary disease: Secondary | ICD-10-CM | POA: Diagnosis not present

## 2024-03-01 DIAGNOSIS — Z1331 Encounter for screening for depression: Secondary | ICD-10-CM | POA: Diagnosis not present

## 2024-03-01 DIAGNOSIS — I429 Cardiomyopathy, unspecified: Secondary | ICD-10-CM | POA: Diagnosis not present

## 2024-03-04 DIAGNOSIS — M545 Low back pain, unspecified: Secondary | ICD-10-CM | POA: Diagnosis not present

## 2024-06-11 ENCOUNTER — Emergency Department

## 2024-06-11 ENCOUNTER — Other Ambulatory Visit: Payer: Self-pay

## 2024-06-11 ENCOUNTER — Inpatient Hospital Stay
Admission: EM | Admit: 2024-06-11 | Discharge: 2024-06-14 | DRG: 481 | Disposition: A | Discharge status: Home Health | Attending: Internal Medicine | Admitting: Internal Medicine

## 2024-06-11 DIAGNOSIS — Z880 Allergy status to penicillin: Secondary | ICD-10-CM

## 2024-06-11 DIAGNOSIS — Z90710 Acquired absence of both cervix and uterus: Secondary | ICD-10-CM

## 2024-06-11 DIAGNOSIS — M810 Age-related osteoporosis without current pathological fracture: Secondary | ICD-10-CM | POA: Diagnosis present

## 2024-06-11 DIAGNOSIS — Z8781 Personal history of (healed) traumatic fracture: Secondary | ICD-10-CM | POA: Diagnosis not present

## 2024-06-11 DIAGNOSIS — I700 Atherosclerosis of aorta: Secondary | ICD-10-CM | POA: Diagnosis not present

## 2024-06-11 DIAGNOSIS — G629 Polyneuropathy, unspecified: Secondary | ICD-10-CM | POA: Diagnosis present

## 2024-06-11 DIAGNOSIS — S72012A Unspecified intracapsular fracture of left femur, initial encounter for closed fracture: Secondary | ICD-10-CM | POA: Diagnosis not present

## 2024-06-11 DIAGNOSIS — E785 Hyperlipidemia, unspecified: Secondary | ICD-10-CM | POA: Diagnosis present

## 2024-06-11 DIAGNOSIS — I10 Essential (primary) hypertension: Secondary | ICD-10-CM | POA: Diagnosis not present

## 2024-06-11 DIAGNOSIS — I509 Heart failure, unspecified: Secondary | ICD-10-CM | POA: Diagnosis not present

## 2024-06-11 DIAGNOSIS — J45909 Unspecified asthma, uncomplicated: Secondary | ICD-10-CM | POA: Diagnosis not present

## 2024-06-11 DIAGNOSIS — I429 Cardiomyopathy, unspecified: Secondary | ICD-10-CM | POA: Diagnosis present

## 2024-06-11 DIAGNOSIS — Z91013 Allergy to seafood: Secondary | ICD-10-CM

## 2024-06-11 DIAGNOSIS — J4489 Other specified chronic obstructive pulmonary disease: Secondary | ICD-10-CM | POA: Diagnosis not present

## 2024-06-11 DIAGNOSIS — Z7901 Long term (current) use of anticoagulants: Secondary | ICD-10-CM

## 2024-06-11 DIAGNOSIS — W19XXXA Unspecified fall, initial encounter: Principal | ICD-10-CM

## 2024-06-11 DIAGNOSIS — Z7952 Long term (current) use of systemic steroids: Secondary | ICD-10-CM

## 2024-06-11 DIAGNOSIS — S72002A Fracture of unspecified part of neck of left femur, initial encounter for closed fracture: Principal | ICD-10-CM | POA: Diagnosis present

## 2024-06-11 DIAGNOSIS — Z7951 Long term (current) use of inhaled steroids: Secondary | ICD-10-CM

## 2024-06-11 DIAGNOSIS — I5022 Chronic systolic (congestive) heart failure: Secondary | ICD-10-CM | POA: Diagnosis present

## 2024-06-11 DIAGNOSIS — J9811 Atelectasis: Secondary | ICD-10-CM | POA: Diagnosis not present

## 2024-06-11 DIAGNOSIS — R0989 Other specified symptoms and signs involving the circulatory and respiratory systems: Secondary | ICD-10-CM | POA: Diagnosis not present

## 2024-06-11 DIAGNOSIS — W06XXXA Fall from bed, initial encounter: Secondary | ICD-10-CM | POA: Diagnosis present

## 2024-06-11 DIAGNOSIS — Z01818 Encounter for other preprocedural examination: Secondary | ICD-10-CM | POA: Diagnosis not present

## 2024-06-11 DIAGNOSIS — Z79899 Other long term (current) drug therapy: Secondary | ICD-10-CM

## 2024-06-11 LAB — BASIC METABOLIC PANEL WITH GFR
Anion gap: 11 (ref 5–15)
BUN: 24 mg/dL — ABNORMAL HIGH (ref 8–23)
CO2: 28 mmol/L (ref 22–32)
Calcium: 9.6 mg/dL (ref 8.9–10.3)
Chloride: 100 mmol/L (ref 98–111)
Creatinine, Ser: 1.19 mg/dL — ABNORMAL HIGH (ref 0.44–1.00)
GFR, Estimated: 45 mL/min — ABNORMAL LOW (ref >60)
Glucose, Bld: 118 mg/dL — ABNORMAL HIGH (ref 70–99)
Potassium: 3.6 mmol/L (ref 3.5–5.1)
Sodium: 139 mmol/L (ref 135–145)

## 2024-06-11 LAB — CBC
HCT: 37.3 % (ref 36.0–46.0)
Hemoglobin: 12.5 g/dL (ref 12.0–15.0)
MCH: 35 pg — ABNORMAL HIGH (ref 26.0–34.0)
MCHC: 33.5 g/dL (ref 30.0–36.0)
MCV: 104.5 fL — ABNORMAL HIGH (ref 80.0–100.0)
Platelets: 189 K/uL (ref 150–400)
RBC: 3.57 MIL/uL — ABNORMAL LOW (ref 3.87–5.11)
RDW: 13 % (ref 11.5–15.5)
WBC: 10.1 K/uL (ref 4.0–10.5)
nRBC: 0 % (ref 0.0–0.2)

## 2024-06-11 MED ORDER — GABAPENTIN 100 MG PO CAPS
100.0000 mg | ORAL_CAPSULE | Freq: Every day | ORAL | Status: DC
  Administered 2024-06-11 – 2024-06-13 (×3): 100 mg via ORAL
  Filled 2024-06-11 (×3): qty 1

## 2024-06-11 MED ORDER — IPRATROPIUM-ALBUTEROL 0.5-2.5 (3) MG/3ML IN SOLN
3.0000 mL | RESPIRATORY_TRACT | Status: DC | PRN
Start: 2024-06-11 — End: 2024-06-14

## 2024-06-11 MED ORDER — ACETAMINOPHEN 325 MG PO TABS: 650.0000 mg | ORAL_TABLET | Freq: Four times a day (QID) | ORAL | Status: DC | PRN

## 2024-06-11 MED ORDER — MORPHINE SULFATE (PF) 2 MG/ML IV SOLN
2.0000 mg | INTRAVENOUS | Status: DC | PRN
  Administered 2024-06-12 – 2024-06-14 (×5): 2 mg via INTRAVENOUS
  Filled 2024-06-11 (×6): qty 1

## 2024-06-11 MED ORDER — HYDRALAZINE HCL 20 MG/ML IJ SOLN: 10.0000 mg | Freq: Four times a day (QID) | INTRAMUSCULAR | Status: DC | PRN

## 2024-06-11 NOTE — ED Provider Notes (Signed)
 [facility] Emergency Department Provider Note     Event Date/Time   First MD Initiated Contact with Patient 06/11/24 1604     (approximate)   History   Fall   HPI  Tiffany Walton is a 83 y.o. female with a past medical history of cardiomyopathy, HFrEF, HLD, asthma and osteoporosis presents to the ED with complaint of left hip pain after rolling out of bed and falling on the floor last night.  Patient reports she was able to take a couple of steps however, unable to stand/walk normally per daughter who is at bedside.  Denies head injury and LOC.  Denies anticoagulation use other than aspirin.  No other complaint.     Physical Exam   Triage Vital Signs: ED Triage Vitals  Encounter Vitals Group     BP 06/11/24 1523 (!) 98/56     Girls Systolic BP Percentile --      Girls Diastolic BP Percentile --      Boys Systolic BP Percentile --      Boys Diastolic BP Percentile --      Pulse Rate 06/11/24 1523 87     Resp 06/11/24 1523 17     Temp 06/11/24 1523 99.4 F (37.4 C)     Temp Source 06/11/24 1523 Oral     SpO2 06/11/24 1523 95 %     Weight 06/11/24 1524 150 lb (68 kg)     Height 06/11/24 1524 5' 5 (1.651 m)     Head Circumference --      Peak Flow --      Pain Score 06/11/24 1523 5     Pain Loc --      Pain Education --      Exclude from Growth Chart --     Most recent vital signs: Vitals:   06/11/24 1523  BP: (!) 98/56  Pulse: 87  Resp: 17  Temp: 99.4 F (37.4 C)  SpO2: 95%    General Awake, no distress.  HEENT NCAT.  CV:  Good peripheral perfusion.  RESP:  Normal effort.  ABD:  No distention.  Other:  No visible deformity to left hip.  No limb shortening noted.  Limited active range of motion at hip joint.  Nontender to palpation over hip.  Pedal pulses palpated equal bilaterally 2 +.    ED Results / Procedures / Treatments   Labs (all labs ordered are listed, but only abnormal results are displayed) Labs Reviewed   CBC - Abnormal; Notable for the following components:      Result Value   RBC 3.57 (*)    MCV 104.5 (*)    MCH 35.0 (*)    All other components within normal limits  BASIC METABOLIC PANEL WITH GFR - Abnormal; Notable for the following components:   Glucose, Bld 118 (*)    BUN 24 (*)    Creatinine, Ser 1.19 (*)    GFR, Estimated 45 (*)    All other components within normal limits   RADIOLOGY  I personally viewed and evaluated these images as part of my medical decision making, as well as reviewing the written report by the radiologist.  ED Provider Interpretation: Acute fracture to the left femoral neck  DG Chest 1 View Result Date: 06/11/2024 CLINICAL DATA:  Preoperative evaluation. EXAM: CHEST  1 VIEW COMPARISON:  11/10/2022. FINDINGS: The heart size and mediastinal contours are within normal limits. There is atherosclerotic calcification of the aorta. Lung volumes are  low with mild atelectasis at the left lung base. No effusion or pneumothorax is seen. No acute osseous abnormality. IMPRESSION: Low lung volumes with atelectasis at the left lung base. Electronically Signed   By: Leita Birmingham M.D.   On: 06/11/2024 16:49   DG Hip Unilat W or Wo Pelvis 2-3 Views Left Result Date: 06/11/2024 CLINICAL DATA:  Trauma to the left hip. EXAM: DG HIP (WITH OR WITHOUT PELVIS) 2-3V LEFT COMPARISON:  None Available. FINDINGS: Impacted fracture of the left femoral neck. No dislocation. The soft tissues are unremarkable. IMPRESSION: Impacted fracture of the left femoral neck. Electronically Signed   By: Vanetta Chou M.D.   On: 06/11/2024 16:06   PROCEDURES:  Critical Care performed: No  Procedures  MEDICATIONS ORDERED IN ED: Medications - No data to display  IMPRESSION / MDM / ASSESSMENT AND PLAN / ED COURSE  I reviewed the triage vital signs and the nursing notes.                              Clinical Course as of 06/11/24 1734  Sat Jun 11, 2024  1634 D/w Dr. Ezra, orthopedics  who reviewed images. Pt will be admitted. Further workup with CT of hip, NPO midnight and hold anticoagulants. [MH]    Clinical Course User Index [MH] [name] LABOR, PA-C    83 y.o. female presents to the emergency department for evaluation and treatment of fall sustaining left hip pain. See HPI for further details.   Differential diagnosis includes, but is not limited to fracture, dislocation, strain  Patient's presentation is most consistent with acute complicated illness / injury requiring diagnostic workup.  Patient is alert and oriented.  She is hemodynamically stable.  Physical exam findings are stated above.  Left hip x-ray shows no acute impacted fracture of the femoral head.  Consulted with orthopedic who will perform surgery in AM.  Please see clinical course note.  Will reach out to hospitalist team for admission.  ----------------------------------------- 5:34 PM on 06/11/2024 -----------------------------------------  Patient accepted to hospital team. Transferring care.   FINAL CLINICAL IMPRESSION(S) / ED DIAGNOSES   Final diagnoses:  Fall, initial encounter  Closed fracture of neck of left femur, initial encounter (HCC)   Rx / DC Orders   ED Discharge Orders     None      Note:  This document was prepared using Dragon voice recognition software and may include unintentional dictation errors.    Margrette, Honor Frison A, PA-C 06/11/24 1735    [name], MD 06/11/24 [phone]

## 2024-06-11 NOTE — H&P (Signed)
 History and Physical    Patient: Tiffany Walton FMW:[id number] DOB: [birth year] DOA: 06/11/2024 DOS: the patient was seen and examined on 06/11/2024 PCP: Valora Lynwood FALCON, MD  Patient coming from: Home  Chief Complaint: Left hip pain Chief Complaint  Patient presents with   Fall   HPI: Tiffany Walton is a 83 y.o. female with medical history significant of arthritis, asthma, hyperlipidemia, peripheral neuropathy who was otherwise well until last night when the patient accidentally rolled out of bed landing on the left hip.  Started feeling some left hip pain and took some over-the-counter medication however pain did not improve and patient this morning began having difficulty walking and therefore came to the emergency room for further management.  Pain is located to the left hip with intensity 5/10 relieved by pain medication administered in the emergency room. Patient did not lose consciousness, denies nausea vomiting abdominal pain chest pain cough or urinary complaint  ED course: Upon presentation to the emergency room temperature 99.4, respiratory rate 17, pulse 87, blood pressure 98/56 saturating 95% on room air Chest x-ray did not show acute pathology CT scan of the left hip showed a left femoral neck fracture with mild impaction, ED physician discussed the case with orthopedic surgeon who agreed for patient to be admitted for surgical intervention tomorrow.  We will keep patient n.p.o. after midnight.  Review of Systems: As mentioned in the history of present illness. All other systems reviewed and are negative. Past Medical History:  Diagnosis Date   Allergic state    Arthritis    osteoporosis   Asthma    History of colon polyps    Hyperlipidemia    Osteoporosis, post-menopausal    Peripheral neuropathy, idiopathic    Past Surgical History:  Procedure Laterality Date   ABDOMINAL HYSTERECTOMY     TAH   APPENDECTOMY     COLONOSCOPY     COLONOSCOPY WITH PROPOFOL  N/A  08/05/2017   Procedure: COLONOSCOPY WITH PROPOFOL ;  Surgeon: Viktoria Lamar DASEN, MD;  Location: [facility] ENDOSCOPY;  Service: Endoscopy;  Laterality: N/A;   FOOT SURGERY  1996   Social History:  reports that she has never smoked. She has never used smokeless tobacco. She reports that she does not drink alcohol and does not use drugs.  Allergies  Allergen Reactions   Penicillins Shortness Of Breath   Shellfish Allergy Anaphylaxis    Family History  Problem Relation Age of Onset   Breast cancer Other        30's    Prior to Admission medications   Medication Sig Start Date End Date Taking? Authorizing Provider  albuterol  (ACCUNEB ) 1.25 MG/3ML nebulizer solution Take 1 ampule by nebulization 2 (two) times daily.    [provider]  albuterol  (VENTOLIN  HFA) 108 (90 Base) MCG/ACT inhaler Inhale 2 puffs into the lungs every 6 (six) hours as needed for wheezing or shortness of breath. 11/10/22 12/10/22  Viviann Pastor, MD  APIXABAN  (ELIQUIS ) VTE STARTER PACK (10MG  AND 5MG ) Take as directed on package: start with two-5mg  tablets twice daily for 7 days. On day 8, switch to one-5mg  tablet twice daily. 11/10/22   Viviann Pastor, MD  ascorbic acid (VITAMIN C) 500 MG tablet Take 500 mg by mouth daily.    [provider]  b complex vitamins capsule Take 1 capsule by mouth daily.    [provider]  budesonide-formoterol (SYMBICORT) 160-4.5 MCG/ACT inhaler Inhale 2 puffs into the lungs 2 (two) times daily.    [provider]  cholecalciferol (VITAMIN D) 400 units TABS tablet Take 1,000 Units by mouth daily.    [provider]  COD LIVER OIL PO Take by mouth daily.    [provider]  gabapentin  (NEURONTIN ) 100 MG capsule Take 100 mg by mouth at bedtime.    [provider]  montelukast (SINGULAIR) 10 MG tablet Take 10 mg by mouth at bedtime.    [provider]  predniSONE (DELTASONE) 10 MG tablet Take 10 mg by mouth daily with  breakfast.    [provider]  raloxifene (EVISTA) 60 MG tablet Take 60 mg by mouth daily.    [provider]  vitamin E 400 UNIT capsule Take 400 Units by mouth daily.    [provider]    Physical Exam: Vitals:   06/11/24 1523 06/11/24 1524  BP: (!) 98/56   Pulse: 87   Resp: 17   Temp: 99.4 F (37.4 C)   TempSrc: Oral   SpO2: 95%   Weight:  68 kg  Height:  5' 5 (1.651 m)   Vitals and nursing note reviewed.  Constitutional: Elderly female laying in bed in no acute distress HENT:     Head: Normocephalic and atraumatic.  Cardiovascular:     Rate and Rhythm: Normal rate and regular rhythm.     Heart sounds: Normal heart sounds.  Pulmonary:     Effort: Pulmonary effort is normal.     Breath sounds: Normal breath sounds.  Abdominal:     Palpations: Abdomen is soft.     Tenderness: There is no abdominal tenderness.  Neurological:     Mental Status: Mental status is at baseline.  Musculoskeletal: Left leg internally rotated Tenderness noted to the left hip area  Data Reviewed:  CT scan of the hip showed a left femoral neck fracture    Latest Ref Rng & Units 06/11/2024    4:47 PM 11/10/2022   11:38 AM 08/14/2014    7:34 PM  CBC  WBC 4.0 - 10.5 K/uL 10.1  4.3  4.4   Hemoglobin 12.0 - 15.0 g/dL 87.4  87.9  87.0   Hematocrit 36.0 - 46.0 % 37.3  37.7  39.3   Platelets 150 - 400 K/uL 189  185  168        Latest Ref Rng & Units 06/11/2024    4:47 PM 11/10/2022   11:38 AM 08/14/2014    7:34 PM  BMP  Glucose 70 - 99 mg/dL 881  865  99   BUN 8 - 23 mg/dL 24  14  10    Creatinine 0.44 - 1.00 mg/dL 8.80  9.13  9.15   Sodium 135 - 145 mmol/L 139  141  136   Potassium 3.5 - 5.1 mmol/L 3.6  3.4  3.7   Chloride 98 - 111 mmol/L 100  105  103   CO2 22 - 32 mmol/L 28  27  27    Calcium 8.9 - 10.3 mg/dL 9.6  8.8  9.6      Assessment and Plan:  Closed left femoral neck fracture Orthopedics Dr. Ezra planning to repair from surgical intervention  tomorrow Will keep n.p.o. after midnight Continue as needed pain medication Avoid anticoagulants at this time given planned surgery  Chronic systolic congestive heart failure Currently not in acute exacerbation Alcohol obtain November 2024 showed EF of 25% Resume home medication as blood pressure allows Monitor input and output Daily weight  Chronic asthma Continue as needed nebulization  Peripheral neuropathy Patient  used to be on gabapentin  however this has been discontinued continue  DVT prophylaxis-continue SCD   Advance Care Planning:   Code Status: Not on file full code  Consults: Orthopedics  Family Communication: Discussed with daughter at bedside  Severity of Illness: The appropriate patient status for this patient is INPATIENT. Inpatient status is judged to be reasonable and necessary in order to provide the required intensity of service to ensure the patient's safety. The patient's presenting symptoms, physical exam findings, and initial radiographic and laboratory data in the context of their chronic comorbidities is felt to place them at high risk for further clinical deterioration. Furthermore, it is not anticipated that the patient will be medically stable for discharge from the hospital within 2 midnights of admission.   * I certify that at the point of admission it is my clinical judgment that the patient will require inpatient hospital care spanning beyond 2 midnights from the point of admission due to high intensity of service, high risk for further deterioration and high frequency of surveillance required.*  Author: Drue ONEIDA Potter, MD 06/11/2024 5:35 PM  For on call review [email or web address].

## 2024-06-11 NOTE — ED Triage Notes (Addendum)
 Pt rolled out of bed (due to new mattress) and fell on the floor last night and took some otc meds but pt was unable to stand well this morning and left hip was internally rotated per daughter. No LOC, no head injury, pt just takes aspirin as blood thinner. Pt reports left hip pain down to thigh.

## 2024-06-12 ENCOUNTER — Inpatient Hospital Stay: Admitting: Certified Registered"

## 2024-06-12 ENCOUNTER — Encounter
Admission: EM | Disposition: A | Discharge status: Home Health | Payer: Self-pay | Source: Home / Self Care | Attending: Internal Medicine

## 2024-06-12 ENCOUNTER — Inpatient Hospital Stay

## 2024-06-12 DIAGNOSIS — J45909 Unspecified asthma, uncomplicated: Secondary | ICD-10-CM | POA: Diagnosis not present

## 2024-06-12 DIAGNOSIS — Z8781 Personal history of (healed) traumatic fracture: Secondary | ICD-10-CM

## 2024-06-12 DIAGNOSIS — G629 Polyneuropathy, unspecified: Secondary | ICD-10-CM | POA: Diagnosis not present

## 2024-06-12 DIAGNOSIS — I5022 Chronic systolic (congestive) heart failure: Secondary | ICD-10-CM | POA: Diagnosis not present

## 2024-06-12 DIAGNOSIS — S72002A Fracture of unspecified part of neck of left femur, initial encounter for closed fracture: Secondary | ICD-10-CM | POA: Diagnosis not present

## 2024-06-12 HISTORY — PX: HIP PINNING,CANNULATED: SHX1758

## 2024-06-12 LAB — BASIC METABOLIC PANEL WITH GFR
Anion gap: 10 (ref 5–15)
BUN: 22 mg/dL (ref 8–23)
CO2: 29 mmol/L (ref 22–32)
Calcium: 9 mg/dL (ref 8.9–10.3)
Chloride: 101 mmol/L (ref 98–111)
Creatinine, Ser: 1.01 mg/dL — ABNORMAL HIGH (ref 0.44–1.00)
GFR, Estimated: 55 mL/min — ABNORMAL LOW (ref >60)
Glucose, Bld: 88 mg/dL (ref 70–99)
Potassium: 3.7 mmol/L (ref 3.5–5.1)
Sodium: 140 mmol/L (ref 135–145)

## 2024-06-12 LAB — CBC
HCT: 35 % — ABNORMAL LOW (ref 36.0–46.0)
Hemoglobin: 11.4 g/dL — ABNORMAL LOW (ref 12.0–15.0)
MCH: 33.6 pg (ref 26.0–34.0)
MCHC: 32.6 g/dL (ref 30.0–36.0)
MCV: 103.2 fL — ABNORMAL HIGH (ref 80.0–100.0)
Platelets: 170 K/uL (ref 150–400)
RBC: 3.39 MIL/uL — ABNORMAL LOW (ref 3.87–5.11)
RDW: 13.2 % (ref 11.5–15.5)
WBC: 6 K/uL (ref 4.0–10.5)
nRBC: 0 % (ref 0.0–0.2)

## 2024-06-12 SURGERY — FIXATION, FEMUR, NECK, PERCUTANEOUS, USING SCREW
Anesthesia: General | Site: Hip | Laterality: Left

## 2024-06-12 MED ORDER — PROPOFOL 10 MG/ML IV BOLUS
INTRAVENOUS | Status: AC
  Filled 2024-06-12: qty 20

## 2024-06-12 MED ORDER — BUPIVACAINE HCL (PF) 0.5 % IJ SOLN
INTRAMUSCULAR | Status: AC
Start: 2024-06-12 — End: 2024-06-12
  Filled 2024-06-12: qty 30

## 2024-06-12 MED ORDER — PHENYLEPHRINE HCL-NACL 20-0.9 MG/250ML-% IV SOLN
INTRAVENOUS | Status: DC | PRN
  Administered 2024-06-12: 30 ug/min via INTRAVENOUS

## 2024-06-12 MED ORDER — FENTANYL CITRATE (PF) 100 MCG/2ML IJ SOLN
INTRAMUSCULAR | Status: DC | PRN
  Administered 2024-06-12: 50 ug via INTRAVENOUS
  Administered 2024-06-12 (×2): 25 ug via INTRAVENOUS

## 2024-06-12 MED ORDER — 0.9 % SODIUM CHLORIDE (POUR BTL) OPTIME
TOPICAL | Status: DC | PRN
  Administered 2024-06-12: 500 mL

## 2024-06-12 MED ORDER — FENTANYL CITRATE (PF) 100 MCG/2ML IJ SOLN: 25.0000 ug | INTRAMUSCULAR | Status: DC | PRN

## 2024-06-12 MED ORDER — SODIUM CHLORIDE 0.9 % IV BOLUS: 500.0000 mL | Freq: Once | INTRAVENOUS | Status: AC

## 2024-06-12 MED ORDER — OXYCODONE HCL 5 MG/5ML PO SOLN: 5.0000 mg | Freq: Once | ORAL | Status: DC | PRN

## 2024-06-12 MED ORDER — ONDANSETRON HCL 4 MG/2ML IJ SOLN
INTRAMUSCULAR | Status: DC | PRN
Start: 2024-06-12 — End: 2024-06-12
  Administered 2024-06-12: 4 mg via INTRAVENOUS

## 2024-06-12 MED ORDER — OXYCODONE HCL 5 MG PO TABS: 5.0000 mg | ORAL_TABLET | Freq: Once | ORAL | Status: DC | PRN

## 2024-06-12 MED ORDER — DEXAMETHASONE SOD PHOSPHATE PF 10 MG/ML IJ SOLN
INTRAMUSCULAR | Status: DC | PRN
  Administered 2024-06-12: 5 mg via INTRAVENOUS

## 2024-06-12 MED ORDER — SODIUM CHLORIDE 0.9 % IV SOLN: INTRAVENOUS | Status: AC | PRN

## 2024-06-12 MED ORDER — PHENYLEPHRINE 80 MCG/ML (10ML) SYRINGE FOR IV PUSH (FOR BLOOD PRESSURE SUPPORT)
PREFILLED_SYRINGE | INTRAVENOUS | Status: DC | PRN
  Administered 2024-06-12 (×3): 80 ug via INTRAVENOUS

## 2024-06-12 MED ORDER — ROCURONIUM BROMIDE 100 MG/10ML IV SOLN
INTRAVENOUS | Status: DC | PRN
  Administered 2024-06-12: 50 mg via INTRAVENOUS

## 2024-06-12 MED ORDER — ACETAMINOPHEN 10 MG/ML IV SOLN
INTRAVENOUS | Status: AC
  Filled 2024-06-12: qty 100

## 2024-06-12 MED ORDER — FENTANYL CITRATE (PF) 100 MCG/2ML IJ SOLN
INTRAMUSCULAR | Status: AC
  Filled 2024-06-12: qty 2

## 2024-06-12 MED ORDER — PROPOFOL 10 MG/ML IV BOLUS
INTRAVENOUS | Status: DC | PRN
  Administered 2024-06-12: 120 mg via INTRAVENOUS

## 2024-06-12 MED ORDER — BUPIVACAINE HCL 0.5 % IJ SOLN
INTRAMUSCULAR | Status: DC | PRN
  Administered 2024-06-12: 20 mL

## 2024-06-12 MED ORDER — LACTATED RINGERS IV SOLN: INTRAVENOUS | Status: DC | PRN

## 2024-06-12 MED ORDER — ACETAMINOPHEN 10 MG/ML IV SOLN
INTRAVENOUS | Status: DC | PRN
  Administered 2024-06-12: 1000 mg via INTRAVENOUS

## 2024-06-12 MED ORDER — SUGAMMADEX SODIUM 200 MG/2ML IV SOLN
INTRAVENOUS | Status: DC | PRN
  Administered 2024-06-12: 200 mg via INTRAVENOUS

## 2024-06-12 MED ORDER — LIDOCAINE HCL (CARDIAC) PF 100 MG/5ML IV SOSY
PREFILLED_SYRINGE | INTRAVENOUS | Status: DC | PRN
  Administered 2024-06-12: 80 mg via INTRAVENOUS

## 2024-06-12 MED ORDER — BUPIVACAINE LIPOSOME 1.3 % IJ SUSP
INTRAMUSCULAR | Status: AC
  Filled 2024-06-12: qty 20

## 2024-06-12 MED ORDER — CEFAZOLIN SODIUM-DEXTROSE 2-4 GM/100ML-% IV SOLN
2.0000 g | Freq: Three times a day (TID) | INTRAVENOUS | Status: AC
  Administered 2024-06-12 – 2024-06-13 (×3): 2 g via INTRAVENOUS
  Filled 2024-06-12 (×2): qty 100

## 2024-06-12 MED ORDER — ENOXAPARIN SODIUM 40 MG/0.4ML IJ SOSY
40.0000 mg | PREFILLED_SYRINGE | INTRAMUSCULAR | Status: DC
  Administered 2024-06-13 – 2024-06-14 (×2): 40 mg via SUBCUTANEOUS
  Filled 2024-06-12 (×2): qty 0.4

## 2024-06-12 SURGICAL SUPPLY — 34 items
BIT DRILL CANNULATED 5.0 (BIT) IMPLANT
BNDG COHESIVE 4X5 TAN STRL LF (GAUZE/BANDAGES/DRESSINGS) ×1 IMPLANT
CHLORAPREP W/TINT 26 (MISCELLANEOUS) ×2 IMPLANT
DRAPE C-ARMOR (DRAPES) IMPLANT
DRAPE INCISE 23X17 STRL (DRAPES) IMPLANT
DRAPE SURG 17X23 STRL (DRAPES) IMPLANT
DRAPE SURG ORHT 6 SPLT 77X108 (DRAPES) IMPLANT
DRAPE U-SHAPE 47X51 STRL (DRAPES) IMPLANT
DRSG OPSITE POSTOP 3X4 (GAUZE/BANDAGES/DRESSINGS) ×1 IMPLANT
DRSG OPSITE POSTOP 4X6 (GAUZE/BANDAGES/DRESSINGS) IMPLANT
DRSG OPSITE POSTOP 4X8 (GAUZE/BANDAGES/DRESSINGS) IMPLANT
DRSG TEGADERM 4X4.75 (GAUZE/BANDAGES/DRESSINGS) IMPLANT
ELECTRODE REM PT RTRN 9FT ADLT (ELECTROSURGICAL) ×1 IMPLANT
GAUZE PETROLATUM 1 X8 (GAUZE/BANDAGES/DRESSINGS) IMPLANT
GLOVE BIO SURGEON STRL SZ7.5 (GLOVE) IMPLANT
GOWN SRG LRG LVL 4 IMPRV REINF (GOWNS) IMPLANT
GOWN STRL REUS W/ TWL LRG LVL3 (GOWN DISPOSABLE) ×1 IMPLANT
GUIDE PIN ORTH 12X3.2X (PIN) IMPLANT
HANDLE YANKAUER SUCT OPEN TIP (MISCELLANEOUS) ×1 IMPLANT
KIT PATIENT CARE HANA TABLE (KITS) IMPLANT
MANIFOLD NEPTUNE II (INSTRUMENTS) ×1 IMPLANT
MAT ABSORB FLUID 56X50 GRAY (MISCELLANEOUS) ×1 IMPLANT
NDL HYPO 22X1.5 SAFETY MO (MISCELLANEOUS) ×1 IMPLANT
NEEDLE HYPO 22X1.5 SAFETY MO (MISCELLANEOUS) ×1 IMPLANT
PACK HIP COMPR (MISCELLANEOUS) ×1 IMPLANT
PENCIL SMOKE EVACUATOR (MISCELLANEOUS) ×1 IMPLANT
SCREW CANN 16MM THD 90X7.0 (Screw) IMPLANT
SCREW CANN RVRS CUT FLT 85X16X (Screw) IMPLANT
STAPLER SKIN PROX 35W (STAPLE) ×1 IMPLANT
SUT VIC AB 0 CT1 36 (SUTURE) ×1 IMPLANT
SUT VIC AB 2-0 CT1 TAPERPNT 27 (SUTURE) ×1 IMPLANT
SYR 20ML LL LF (SYRINGE) ×1 IMPLANT
SYR 5ML LL (SYRINGE) ×1 IMPLANT
TRAP FLUID SMOKE EVACUATOR (MISCELLANEOUS) ×1 IMPLANT

## 2024-06-12 NOTE — Consult Note (Signed)
 ORTHOPAEDIC CONSULTATION  REQUESTING PHYSICIAN: Dorinda Drue DASEN, MD  Chief Complaint:   Left hip pain  History of Present Illness: Tiffany Walton is a 83 y.o. female who presents 1 day after falling out of her bed and landing onto her left hip.  Initially, she was able to bear weight onto the left lower extremity and ambulate.  However, upon waking up yesterday morning she had increased left hip pain and difficulty ambulating.  She was brought into the emergency department yesterday afternoon and was found to have a left valgus impacted femoral neck fracture.  At baseline she ambulates with the assistance of a cane typically.  She is with her son during the history and physical this morning.  Past Medical History:  Diagnosis Date   Allergic state    Arthritis    osteoporosis   Asthma    History of colon polyps    Hyperlipidemia    Osteoporosis, post-menopausal    Peripheral neuropathy, idiopathic    Past Surgical History:  Procedure Laterality Date   ABDOMINAL HYSTERECTOMY     TAH   APPENDECTOMY     COLONOSCOPY     COLONOSCOPY WITH PROPOFOL  N/A 08/05/2017   Procedure: COLONOSCOPY WITH PROPOFOL ;  Surgeon: Viktoria Lamar DASEN, MD;  Location: [facility] ENDOSCOPY;  Service: Endoscopy;  Laterality: N/A;   FOOT SURGERY  1996   Social History   Socioeconomic History   Marital status: Married    Spouse name: Not on file   Number of children: Not on file   Years of education: Not on file   Highest education level: Not on file  Occupational History   Not on file  Tobacco Use   Smoking status: Never   Smokeless tobacco: Never  Substance and Sexual Activity   Alcohol use: No   Drug use: No   Sexual activity: Not on file  Other Topics Concern   Not on file  Social History Narrative   Not on file   Social Drivers of Health   Financial Resource Strain: Patient Declined (04/20/2023)   Received from [facility]   Overall Financial Resource Strain (CARDIA)    Difficulty of Paying Living Expenses: Patient declined  Food Insecurity: No Food Insecurity (06/11/2024)   Hunger Vital Sign    Worried About Running Out of Food in the Last Year: Never true    Ran Out of Food in the Last Year: Never true  Transportation Needs: No Transportation Needs (06/11/2024)   PRAPARE - Administrator, Civil Service (Medical): No    Lack of Transportation (Non-Medical): No  Physical Activity: Not on file  Stress: Not on file  Social Connections: Unknown (06/11/2024)   Social Connection and Isolation Panel    Frequency of Communication with Friends and Family: More than three times a week    Frequency of Social Gatherings with Friends and Family: More than three times a week    Attends Religious Services: Patient declined    Database Administrator or Organizations: Patient declined    Attends Banker Meetings: Patient declined    Marital Status: Patient declined   Family History  Problem Relation Age of Onset   Breast cancer Other        30's   Allergies  Allergen Reactions   Penicillins Shortness Of Breath   Shellfish Allergy Anaphylaxis   Prior to Admission medications   Medication Sig Start Date End Date Taking? Authorizing Provider  albuterol  (ACCUNEB ) 1.25 MG/3ML  nebulizer solution Take 1 ampule by nebulization 2 (two) times daily.   Yes [provider]  ascorbic acid (VITAMIN C) 500 MG tablet Take 500 mg by mouth daily.   Yes [provider]  b complex vitamins capsule Take 1 capsule by mouth daily.   Yes [provider]  budesonide-formoterol (SYMBICORT) 160-4.5 MCG/ACT inhaler Inhale 2 puffs into the lungs 2 (two) times daily.   Yes [provider]  cholecalciferol (VITAMIN D) 400 units TABS tablet Take 1,000 Units by mouth daily.   Yes [provider]  COD LIVER OIL PO Take by mouth daily.   Yes [provider]  gabapentin  (NEURONTIN ) 100 MG capsule Take 100 mg by mouth at bedtime.   Yes [provider]  montelukast (SINGULAIR) 10 MG tablet Take 10 mg by mouth at bedtime.   Yes [provider]  predniSONE (DELTASONE) 10 MG tablet Take 10 mg by mouth daily with breakfast.   Yes [provider]  raloxifene (EVISTA) 60 MG tablet Take 60 mg by mouth daily.   Yes [provider]  vitamin E 400 UNIT capsule Take 400 Units by mouth daily.   Yes [provider]  albuterol  (VENTOLIN  HFA) 108 (90 Base) MCG/ACT inhaler Inhale 2 puffs into the lungs every 6 (six) hours as needed for wheezing or shortness of breath. 11/10/22 12/10/22  Viviann Pastor, MD  APIXABAN  (ELIQUIS ) VTE STARTER PACK (10MG  AND 5MG ) Take as directed on package: start with two-5mg  tablets twice daily for 7 days. On day 8, switch to one-5mg  tablet twice daily. Patient not taking: Reported on 06/11/2024 11/10/22   Viviann Pastor, MD   Recent Labs    06/11/24 1647 06/12/24 0543  WBC 10.1 6.0  HGB 12.5 11.4*  HCT 37.3 35.0*  PLT 189 170  K 3.6 3.7  CL 100 101  CO2 28 29  BUN 24* 22  CREATININE 1.19* 1.01*  GLUCOSE 118* 88  CALCIUM 9.6 9.0   CT Hip Left Wo Contrast Result Date: 06/11/2024 EXAM: CT OF THE LEFT HIP WITHOUT IV CONTRAST 06/11/2024 05:44:06 PM TECHNIQUE: CT of the left hip was performed without the administration of intravenous contrast. Multiplanar reformatted images are provided for review. Automated exposure control, iterative reconstruction, and/or weight based adjustment of the mA/kV was utilized to reduce the radiation dose to as low as reasonably achievable. COMPARISON: Plain films earlier today. CLINICAL HISTORY: Hip surgical planning. FINDINGS: BONES: Left femoral neck fracture noted with mild impaction. No subluxation or dislocation. No aggressive appearing osseous abnormality or periostitis. SOFT TISSUE: No significant soft tissue edema or fluid collections. No soft  tissue mass. JOINT: No significant degenerative changes. No osseous erosions. INTRAPELVIC CONTENTS: Limited images of the intrapelvic contents are unremarkable. IMPRESSION: 1. Left femoral neck fracture with mild impaction. Electronically signed by: Franky Crease MD 06/11/2024 05:47 PM EST RP Workstation: [id number]   DG Chest 1 View Result Date: 06/11/2024 CLINICAL DATA:  Preoperative evaluation. EXAM: CHEST  1 VIEW COMPARISON:  11/10/2022. FINDINGS: The heart size and mediastinal contours are within normal limits. There is atherosclerotic calcification of the aorta. Lung volumes are low with mild atelectasis at the left lung base. No effusion or pneumothorax is seen. No acute osseous abnormality. IMPRESSION: Low lung volumes with atelectasis at the left lung base. Electronically Signed   By: Leita Birmingham M.D.   On: 06/11/2024 16:49   DG Hip Unilat W or Wo Pelvis 2-3 Views Left Result Date: 06/11/2024 CLINICAL DATA:  Trauma to the  left hip. EXAM: DG HIP (WITH OR WITHOUT PELVIS) 2-3V LEFT COMPARISON:  None Available. FINDINGS: Impacted fracture of the left femoral neck. No dislocation. The soft tissues are unremarkable. IMPRESSION: Impacted fracture of the left femoral neck. Electronically Signed   By: Vanetta Chou M.D.   On: 06/11/2024 16:06     Positive ROS: All other systems have been reviewed and were otherwise negative with the exception of those mentioned in the HPI and as above.  Physical Exam: BP (!) 105/59   Pulse 82   Temp 99.1 F (37.3 C)   Resp 16   Ht 5' 5 (1.651 m)   Wt 68.8 kg   SpO2 96%   BMI 25.24 kg/m  General:  Alert, no acute distress Psychiatric:  Patient with normal mood and affect     Orthopedic Exam:  Left lower Extremity: + DF/PF/EHL SILT grossly over foot Foot wwp +Log roll/axial load  Imaging:  As above: Left valgus impacted femoral neck fracture  Assessment/Plan: Patient with a left valgus impacted femoral neck fracture - We discussed the  various treatment options and the risk, benefits, alternatives to operative intervention - Risks include infection, nonunion, malunion, AVN of the femoral head, need to convert to an arthroplasty, chronic pain, blood clot, difficulty with ambulation, death - The patient would like to proceed with operative intervention - Plan for screw fixation of the femoral neck    Jackquline CANDIE Barrack, MD Orthopaedic Surgery [facility]

## 2024-06-12 NOTE — Progress Notes (Signed)
 Progress Note   Patient: Tiffany Walton FMW:[id number] DOB: 16-Jun-1941 DOA: 06/11/2024     1 DOS: the patient was seen and examined on 06/12/2024   Brief hospital course: LEISA GAULT is a 83 y.o. female with medical history significant of arthritis, asthma, hyperlipidemia, peripheral neuropathy who was otherwise well until last night when the patient accidentally rolled out of bed landing on the left hip.  Started feeling some left hip pain and took some over-the-counter medication however pain did not improve and patient this morning began having difficulty walking and therefore came to the emergency room for further management.  Pain is located to the left hip with intensity 5/10 relieved by pain medication administered in the emergency room. Patient did not lose consciousness, denies nausea vomiting abdominal pain chest pain cough or urinary complaint   ED course: Upon presentation to the emergency room temperature 99.4, respiratory rate 17, pulse 87, blood pressure 98/56 saturating 95% on room air Chest x-ray did not show acute pathology CT scan of the left hip showed a left femoral neck fracture with mild impaction, ED physician discussed the case with orthopedic surgeon who agreed for patient to be admitted for surgical intervention tomorrow.  We will keep patient n.p.o. after midnight.  Assessment and Plan:    Closed left femoral neck fracture Orthopedics Dr. Ezra on board and case discussed Patient underwent surgical repair on 06/12/2024 Diet has been resumed Continue as needed pain medication    Chronic systolic congestive heart failure Currently not in acute exacerbation Echo on November 2024 showed EF of 25% Resume home medication as blood pressure allows Monitor input and output Daily weight   Chronic asthma Continue as needed nebulization   Peripheral neuropathy Patient used to be on gabapentin  however this has been discontinued continue   DVT  prophylaxis-Lovenox   Subjective:  Patient seen and examined at bedside this morning Denies nausea vomiting or chest pain Hip pain manageable  Physical Exam: Constitutional: Elderly female laying in bed in no acute distress HENT:     Head: Normocephalic and atraumatic.  Cardiovascular:     Rate and Rhythm: Normal rate and regular rhythm.     Heart sounds: Normal heart sounds.  Pulmonary:     Effort: Pulmonary effort is normal.     Breath sounds: Normal breath sounds.  Abdominal:     Palpations: Abdomen is soft.     Tenderness: There is no abdominal tenderness.  Neurological:     Mental Status: Mental status is at baseline.  Musculoskeletal: Left leg internally rotated Tenderness noted to the left hip area  Vitals:   06/12/24 1400 06/12/24 1415 06/12/24 1421 06/12/24 1449  BP: (!) 116/94 108/63 107/60 (!) 111/59  Pulse: 75 72 74   Resp: 14 17 18 18   Temp:   (!) 97.5 F (36.4 C) (!) 97.5 F (36.4 C)  TempSrc:      SpO2: 98% 100% 99% 99%  Weight:      Height:        Data Reviewed:  X-ray of the hip showing fracture of the left femoral neck    Latest Ref Rng & Units 06/12/2024    5:43 AM 06/11/2024    4:47 PM 11/10/2022   11:38 AM  BMP  Glucose 70 - 99 mg/dL 88  881  865   BUN 8 - 23 mg/dL 22  24  14    Creatinine 0.44 - 1.00 mg/dL 8.98  8.80  9.13   Sodium 135 - 145 mmol/L  140  139  141   Potassium 3.5 - 5.1 mmol/L 3.7  3.6  3.4   Chloride 98 - 111 mmol/L 101  100  105   CO2 22 - 32 mmol/L 29  28  27    Calcium 8.9 - 10.3 mg/dL 9.0  9.6  8.8       Family Communication: Discussed with patient's son at bedside   Time spent: 51 minutes  Author: Drue ONEIDA Potter, MD 06/12/2024 3:42 PM  For on call review [email or web address].

## 2024-06-12 NOTE — Op Note (Signed)
 DATE OF SURGERY: 06/12/2024  PREOPERATIVE DIAGNOSIS: Left valgus impacted femoral neck fracture  POSTOPERATIVE DIAGNOSIS: Left valgus impacted femoral neck fracture  PROCEDURE: Percutaneous pinning of Left femoral neck fracture (CPT [phone])  SURGEON: Jackquline CANDIE Barrack, MD  ASSISTANTS: none  EBL: 25 cc  COMPONENTS:  Implant Name Type Inv. Item Serial No. Manufacturer Lot No. LRB No. Used Action  LOG [id number] - ZIMMER 7.0MM CANNULATED SCREWS - 1          SCREW CANN THD 90X7.0 - [id number] Screw SCREW CANN THD 90X7.0  ZIMMER RECON(ORTH,TRAU,BIO,SG) N/A ON STERILE SET Left 1 Implanted  SCREW CANN RVRS CUT FLT 85X16X - [id number] Screw SCREW CANN RVRS CUT FLT 85X16X  ZIMMER RECON(ORTH,TRAU,BIO,SG) N/A ON STERILE SET Left 2 Implanted     INDICATIONS: Tiffany Walton is a 83 y.o. female who sustained a valgus impacted femoral neck fracture after a fall. Risks and benefits of percutaneous pinning were explained to the patient and family. Risks include but are not limited to bleeding, infection, injury to tissues, nerves, vessels, DVT/PE, malunion/nonunion, hardware failure, and risks of anesthesia. The patient and family understand these risks, have completed an informed consent, and wish to proceed.   PROCEDURE:  The patient was brought into the operating room. After administering spinal anesthesia, the patient was placed in the supine position on the Hana table. The uninjured leg was extended while the injured lower extremity was placed in a neutral position with care taken to not displace the fracture during positioning. The lateral aspects of the operative hip and thigh were prepped with ChloraPrep solution before being draped sterilely. IV antibiotics were administered. A timeout was performed to verify the appropriate surgical site, patient, and procedure.   The greater trochanter was identified and an approximately 5 cm incision was made over the lateral aspect of the proximal  femur. The incision was carried down through the subcutaneous tissues to expose the IT band. This was split at the proximal portion of the incision and the vastus lateralis was split in line with its fibers. The lateral aspect of the femur was cleared of soft tissue. Under fluoroscopic guidance, a guidewire was placed along the inferior aspect of the femoral neck into the head while ensuring the start point on the lateral cortex was not below the level of the lesser trochanter. A parallel guide was used to place two additional guidepins (superior posterior and superior anterior). Position of all pins was verified fluoroscopically in both the AP and lateral views. A measuring device was used the measure appropriate screw length. The guidepins were drilled. Appropriately sized screws were advanced starting with the inferior screw, then the two superior screws. Screws were sequentially tightened. Hardware position and bony alignment was confirmed fluoroscopically with AP and lateral views.   The wounds were irrigated thoroughly with sterile saline solution. The IT band was closed with 0-Vicryl. Deep fat sutures were also placed with 0-Vicryl. The subcutaneous tissues were closed using 2-0 Vicryl interrupted sutures. The skin was closed using staples. Sterile occlusive dressing was applied. The patient was then transferred to the recovery room in satisfactory condition after tolerating the procedure well.  POSTOPERATIVE PLAN: The patient will be WBAT on the operative extremity. Lovenox  40mg /day x 4 weeks to start this evening. Ancef  x 24 hours. PT/OT on POD#1.

## 2024-06-12 NOTE — Anesthesia Preprocedure Evaluation (Signed)
 Anesthesia Evaluation  Patient identified by MRN, date of birth, ID band Patient awake    Reviewed: Allergy & Precautions, NPO status , Patient's Chart, lab work & pertinent test results  Airway Mallampati: II  TM Distance: >3 FB Neck ROM: full    Dental  (+) Teeth Intact   Pulmonary asthma , COPD   Pulmonary exam normal        Cardiovascular +CHF and + DOE  Normal cardiovascular exam+ Valvular Problems/Murmurs MR      Neuro/Psych  Neuromuscular disease negative neurological ROS  negative psych ROS   GI/Hepatic negative GI ROS, Neg liver ROS,,,  Endo/Other  negative endocrine ROS    Renal/GU      Musculoskeletal   Abdominal   Peds  Hematology negative hematology ROS (+)   Anesthesia Other Findings Patient with PMH of combined systolic and diastolic heart failure. EF from echo on 1/24 was 25% with moderate MR. Patient states she is normally able to climb a flight of stairs before her fractured hip. Admits to shortness of breath with exertion. Explained risks/benefits of general vs spinal. Patient elected for general with ETT and declined spinal. Patient is at a higher risk for complications due to her condition.  Echocardiogram 2D complete: (08/11/22) INTERPRETATION SEVERE LV SYSTOLIC DYSFUNCTION (See above) NORMAL RIGHT VENTRICULAR SYSTOLIC FUNCTION NO VALVULAR STENOSIS MILD to MODERATE AR MODERATE MR MILD TR EF 25-30% Grade I diastolic dysfunction    Past Medical History: No date: Allergic state No date: Arthritis     Comment:  osteoporosis No date: Asthma No date: History of colon polyps No date: Hyperlipidemia No date: Osteoporosis, post-menopausal No date: Peripheral neuropathy, idiopathic  Past Surgical History: No date: ABDOMINAL HYSTERECTOMY     Comment:  TAH No date: APPENDECTOMY No date: COLONOSCOPY 08/05/2017: COLONOSCOPY WITH PROPOFOL ; N/A     Comment:  Procedure: COLONOSCOPY WITH  PROPOFOL ;  Surgeon: Viktoria Lamar DASEN, MD;  Location: [facility] ENDOSCOPY;  Service:               Endoscopy;  Laterality: N/A; 1996: FOOT SURGERY  BMI    Body Mass Index: 25.24 kg/m      Reproductive/Obstetrics negative OB ROS                              Anesthesia Physical Anesthesia Plan  ASA: 3  Anesthesia Plan: General ETT   Post-op Pain Management:    Induction: Intravenous  PONV Risk Score and Plan: 3 and Ondansetron  and Dexamethasone   Airway Management Planned: Oral ETT  Additional Equipment:   Intra-op Plan:   Post-operative Plan: Extubation in OR  Informed Consent: I have reviewed the patients History and Physical, chart, labs and discussed the procedure including the risks, benefits and alternatives for the proposed anesthesia with the patient or authorized representative who has indicated his/her understanding and acceptance.     Dental Advisory Given  Plan Discussed with: Anesthesiologist, CRNA and Surgeon  Anesthesia Plan Comments: (Patient consented for risks of anesthesia including but not limited to:  - adverse reactions to medications - damage to eyes, teeth, lips or other oral mucosa - nerve damage due to positioning  - sore throat or hoarseness - Damage to heart, brain, nerves, lungs, other parts of body or loss of life  Patient voiced understanding and assent.)        Anesthesia Quick Evaluation

## 2024-06-12 NOTE — Anesthesia Procedure Notes (Signed)
 Procedure Name: Intubation Date/Time: 06/12/2024 12:03 PM  Performed by: Rosine Shona Jansky, CRNAPre-anesthesia Checklist: Patient identified, Emergency Drugs available, Suction available and Patient being monitored Patient Re-evaluated:Patient Re-evaluated prior to induction Oxygen Delivery Method: Circle system utilized Preoxygenation: Pre-oxygenation with 100% oxygen Induction Type: IV induction Ventilation: Mask ventilation without difficulty Laryngoscope Size: McGrath and 3 Grade View: Grade I Tube type: Oral Tube size: 7.0 mm Number of attempts: 1 Airway Equipment and Method: Stylet and Oral airway Placement Confirmation: ETT inserted through vocal cords under direct vision, positive ETCO2 and breath sounds checked- equal and bilateral Secured at: 21 cm Tube secured with: Tape Dental Injury: Teeth and Oropharynx as per pre-operative assessment

## 2024-06-12 NOTE — Transfer of Care (Signed)
 Immediate Anesthesia Transfer of Care Note  Patient: Tiffany Walton  Procedure(s) Performed: FIXATION, FEMUR, NECK, PERCUTANEOUS, USING SCREW (Left: Hip)  Patient Location: PACU  Anesthesia Type:General  Level of Consciousness: drowsy  Airway & Oxygen Therapy: Patient Spontanous Breathing and Patient connected to face mask oxygen  Post-op Assessment: Report given to RN  Post vital signs: stable  Last Vitals:  Vitals Value Taken Time  BP 115/55 06/12/24 13:22  Temp    Pulse 93 06/12/24 13:25  Resp 29 06/12/24 13:25  SpO2 100 % 06/12/24 13:25  Vitals shown include unfiled device data.  Last Pain:  Vitals:   06/12/24 0901  TempSrc:   PainSc: 0-No pain      Patients Stated Pain Goal: 0 (06/12/24 0901)  Complications: No notable events documented.

## 2024-06-12 NOTE — Plan of Care (Signed)

## 2024-06-12 NOTE — Anesthesia Postprocedure Evaluation (Signed)
 Anesthesia Post Note  Patient: Tiffany Walton  Procedure(s) Performed: FIXATION, FEMUR, NECK, PERCUTANEOUS, USING SCREW (Left: Hip)  Patient location during evaluation: PACU Anesthesia Type: General Level of consciousness: awake and alert Pain management: pain level controlled Vital Signs Assessment: post-procedure vital signs reviewed and stable Respiratory status: spontaneous breathing, nonlabored ventilation, respiratory function stable and patient connected to nasal cannula oxygen Cardiovascular status: blood pressure returned to baseline and stable Postop Assessment: no apparent nausea or vomiting Anesthetic complications: no   No notable events documented.   Last Vitals:  Vitals:   06/12/24 1449 06/12/24 1559  BP: (!) 111/59 (!) 98/54  Pulse:  80  Resp: 18 18  Temp: (!) 36.4 C   SpO2: 99% 98%    Last Pain:  Vitals:   06/12/24 1421  TempSrc:   PainSc: 0-No pain                 Debby Mines

## 2024-06-13 DIAGNOSIS — G629 Polyneuropathy, unspecified: Secondary | ICD-10-CM | POA: Diagnosis not present

## 2024-06-13 DIAGNOSIS — I5022 Chronic systolic (congestive) heart failure: Secondary | ICD-10-CM | POA: Diagnosis not present

## 2024-06-13 DIAGNOSIS — J45909 Unspecified asthma, uncomplicated: Secondary | ICD-10-CM | POA: Diagnosis not present

## 2024-06-13 DIAGNOSIS — S72002A Fracture of unspecified part of neck of left femur, initial encounter for closed fracture: Secondary | ICD-10-CM | POA: Diagnosis not present

## 2024-06-13 DIAGNOSIS — Z8781 Personal history of (healed) traumatic fracture: Secondary | ICD-10-CM | POA: Diagnosis not present

## 2024-06-13 LAB — CBC WITH DIFFERENTIAL/PLATELET
Abs Immature Granulocytes: 0.02 10*3/uL (ref 0.00–0.07)
Basophils Absolute: 0 10*3/uL (ref 0.0–0.1)
Basophils Relative: 0 %
Eosinophils Absolute: 0 10*3/uL (ref 0.0–0.5)
Eosinophils Relative: 0 %
HCT: 32.6 % — ABNORMAL LOW (ref 36.0–46.0)
Hemoglobin: 10.3 g/dL — ABNORMAL LOW (ref 12.0–15.0)
Immature Granulocytes: 0 %
Lymphocytes Relative: 6 %
Lymphs Abs: 0.5 10*3/uL — ABNORMAL LOW (ref 0.7–4.0)
MCH: 33.8 pg (ref 26.0–34.0)
MCHC: 31.6 g/dL (ref 30.0–36.0)
MCV: 106.9 fL — ABNORMAL HIGH (ref 80.0–100.0)
Monocytes Absolute: 0.9 10*3/uL (ref 0.1–1.0)
Monocytes Relative: 10 %
Neutro Abs: 8 10*3/uL — ABNORMAL HIGH (ref 1.7–7.7)
Neutrophils Relative %: 84 %
Platelets: 152 10*3/uL (ref 150–400)
RBC: 3.05 MIL/uL — ABNORMAL LOW (ref 3.87–5.11)
RDW: 12.8 % (ref 11.5–15.5)
WBC: 9.5 10*3/uL (ref 4.0–10.5)
nRBC: 0 % (ref 0.0–0.2)

## 2024-06-13 LAB — BASIC METABOLIC PANEL WITH GFR
Anion gap: 8 (ref 5–15)
BUN: 26 mg/dL — ABNORMAL HIGH (ref 8–23)
CO2: 27 mmol/L (ref 22–32)
Calcium: 8.4 mg/dL — ABNORMAL LOW (ref 8.9–10.3)
Chloride: 103 mmol/L (ref 98–111)
Creatinine, Ser: 1.02 mg/dL — ABNORMAL HIGH (ref 0.44–1.00)
GFR, Estimated: 54 mL/min — ABNORMAL LOW (ref >60)
Glucose, Bld: 130 mg/dL — ABNORMAL HIGH (ref 70–99)
Potassium: 4.3 mmol/L (ref 3.5–5.1)
Sodium: 138 mmol/L (ref 135–145)

## 2024-06-13 MED ORDER — HYDROXYZINE HCL 25 MG PO TABS
25.0000 mg | ORAL_TABLET | Freq: Three times a day (TID) | ORAL | Status: DC | PRN
  Filled 2024-06-13: qty 1

## 2024-06-13 MED ORDER — ENOXAPARIN SODIUM 40 MG/0.4ML IJ SOSY: 40.0000 mg | PREFILLED_SYRINGE | INTRAMUSCULAR | 0 refills | Status: DC

## 2024-06-13 MED ORDER — ACETAMINOPHEN 325 MG PO TABS: 650.0000 mg | ORAL_TABLET | Freq: Four times a day (QID) | ORAL | Status: DC | PRN

## 2024-06-13 MED ORDER — OXYCODONE-ACETAMINOPHEN 7.5-325 MG PO TABS
1.0000 | ORAL_TABLET | ORAL | Status: DC | PRN
  Administered 2024-06-13 – 2024-06-14 (×4): 1 via ORAL
  Filled 2024-06-13 (×4): qty 1

## 2024-06-13 MED ORDER — TRAMADOL HCL 50 MG PO TABS: 50.0000 mg | ORAL_TABLET | Freq: Four times a day (QID) | ORAL | 0 refills | Status: DC | PRN

## 2024-06-13 NOTE — Evaluation (Signed)
 Physical Therapy Evaluation Patient Details Name: Tiffany Walton MRN: [id number] DOB: [birth year] Today's Date: 06/13/2024  History of Present Illness  KANIA REGNIER is a 83 y.o. female with medical history significant of arthritis, asthma, hyperlipidemia, peripheral neuropathy who was otherwise well until last night when the patient accidentally rolled out of bed landing on the left hip. Pt today s/p L hip percutaneous pinning of Left femoral neck fracture.  Clinical Impression  Patient noted to be in supine position at PT arrival in room, for an initial PT evaluation due to a decline in functional status, with baseline mobility reported as independent, and currently requiring min/CGA for ambulation. The patient is A&O x 4, presenting with good willingness to work with PT and goals of going home. The overall clinical impression is that the patient presents with mild mobility limitations secondary to ortho surgery. Recommended skilled PT will address safety, mobility, and discharge planning. PT recommendation to d/c patient to home with HHPT upon medical clearance.          If plan is discharge home, recommend the following: A little help with walking and/or transfers;A little help with bathing/dressing/bathroom;Help with stairs or ramp for entrance   Can travel by private vehicle        Equipment Recommendations Rolling walker (2 wheels)  Recommendations for Other Services       Functional Status Assessment Patient has had a recent decline in their functional status and demonstrates the ability to make significant improvements in function in a reasonable and predictable amount of time.     Precautions / Restrictions Restrictions Weight Bearing Restrictions Per Provider Order: No      Mobility  Bed Mobility Overal bed mobility: Needs Assistance Bed Mobility: Supine to Sit     Supine to sit: Min assist, Used rails          Transfers Overall transfer level: Needs  assistance Equipment used: Rolling walker (2 wheels) Transfers: Sit to/from Stand Sit to Stand: Mod assist, From elevated surface                Ambulation/Gait Ambulation/Gait assistance: Min assist, Contact guard assist Gait Distance (Feet): 200 Feet Assistive device: Rolling walker (2 wheels) Gait Pattern/deviations: Step-through pattern, Decreased stance time - left, Decreased stride length, Narrow base of support, Drifts right/left Gait velocity: deceased     General Gait Details: vc for RW management and upright posture  Stairs            Wheelchair Mobility     Tilt Bed    Modified Rankin (Stroke Patients Only)       Balance Overall balance assessment: Needs assistance Sitting-balance support: Feet supported Sitting balance-Leahy Scale: Good     Standing balance support: During functional activity, Reliant on assistive device for balance Standing balance-Leahy Scale: Fair Standing balance comment: able to reach outside BOS with no LOB                             Pertinent Vitals/Pain Pain Assessment Pain Assessment: 0-10 Pain Score: 3  Pain Location: L hip Pain Descriptors / Indicators: Aching Pain Intervention(s): Limited activity within patient's tolerance    Home Living Family/patient expects to be discharged to:: Private residence Living Arrangements: Children Available Help at Discharge: Family;Available PRN/intermittently;Available 24 hours/day Type of Home: House Home Access: Stairs to enter Entrance Stairs-Rails: None Entrance Stairs-Number of Steps: 1   Home Layout: One level;Two level;Able to live on  main level with bedroom/bathroom Home Equipment: Cane - single point;Wheelchair - manual;Grab bars - toilet;Grab bars - tub/shower      Prior Function Prior Level of Function : Independent/Modified Independent             Mobility Comments: independent mostly for all ADLs       Extremity/Trunk Assessment    Upper Extremity Assessment Upper Extremity Assessment: Overall WFL for tasks assessed    Lower Extremity Assessment Lower Extremity Assessment: Generalized weakness    Cervical / Trunk Assessment Cervical / Trunk Assessment: Kyphotic  Communication   Communication Communication: No apparent difficulties    Cognition Arousal: Alert Behavior During Therapy: WFL for tasks assessed/performed   PT - Cognitive impairments: No apparent impairments                         Following commands: Intact       Cueing Cueing Techniques: Verbal cues     General Comments      Exercises     Assessment/Plan    PT Assessment Patient needs continued PT services  PT Problem List Decreased strength;Decreased activity tolerance;Decreased balance;Decreased mobility       PT Treatment Interventions DME instruction;Gait training;Stair training;Functional mobility training;Therapeutic activities;Therapeutic exercise;Balance training;Neuromuscular re-education;Patient/family education    PT Goals (Current goals can be found in the Care Plan section)  Acute Rehab PT Goals Patient Stated Goal: Pt wants to go home PT Goal Formulation: With patient/family Time For Goal Achievement: 07/11/24 Potential to Achieve Goals: Good    Frequency Min 2X/week     Co-evaluation               AM-PAC PT 6 Clicks Mobility  Outcome Measure Help needed turning from your back to your side while in a flat bed without using bedrails?: None Help needed moving from lying on your back to sitting on the side of a flat bed without using bedrails?: None Help needed moving to and from a bed to a chair (including a wheelchair)?: A Little Help needed standing up from a chair using your arms (e.g., wheelchair or bedside chair)?: A Little Help needed to walk in hospital room?: A Little Help needed climbing 3-5 steps with a railing? : A Lot 6 Click Score: 19    End of Session Equipment Utilized  During Treatment: Gait belt Activity Tolerance: Patient tolerated treatment well Patient left: in chair;with chair alarm set;with call bell/phone within reach Nurse Communication: Mobility status PT Visit Diagnosis: Unsteadiness on feet (R26.81);Other abnormalities of gait and mobility (R26.89);History of falling (Z91.81);Difficulty in walking, not elsewhere classified (R26.2)    Time: 9095-9068 PT Time Calculation (min) (ACUTE ONLY): 27 min   Charges:   PT Evaluation $PT Eval Low Complexity: 1 Low   PT General Charges $$ ACUTE PT VISIT: 1 Visit         Sherlean Lesches DPT, PT    Venus Gilles A Marai Teehan 06/13/2024, 11:06 AM

## 2024-06-13 NOTE — Evaluation (Signed)
 Occupational Therapy Evaluation Patient Details Name: Tiffany Walton MRN: [id number] DOB: 1940-08-31 Today's Date: 06/13/2024   History of Present Illness   Pt is a 83 y.o. female presented to ED s/p fall/rolling OOB resulting in Left femoral neck fracture s/p L hip percutaneous pinning. PMH of arthritis, asthma, hyperlipidemia, peripheral neuropathy.     Clinical Impressions Pt was seen for OT evaluation this date. PTA, pt resides with her daughter in a one level home. At baseline, she ambulates using a SPC household and community distances. She is typically MOD I with ADL performance. Pt presents with deficits in strength, balance and pain, affecting safe and optimal ADL completion. Pt currently requires Mod A for STS from recliner and toilet during session d/t low height with cues for hand placement and LLE management to better manage pain. LB dressing to doff/donn RLE sock with supervision, anticipate Min A for other LB dressing tasks and edu on use of reacher and long handled sponge to maximize ease/safety and better manage pain. Ambulated ~120 ft using RW with CGA and good safety, cues for sequencing. Recommend RW and BSC for DC home to maximize safety and IND. Pt would benefit from skilled OT services to address functional limitations to maximize safety and independence while minimizing future risk of falls, injury, and readmission. Do anticipate the need for follow up OT services upon acute hospital DC.      If plan is discharge home, recommend the following:   A little help with walking and/or transfers;A little help with bathing/dressing/bathroom;Help with stairs or ramp for entrance;Assistance with cooking/housework     Functional Status Assessment   Patient has had a recent decline in their functional status and demonstrates the ability to make significant improvements in function in a reasonable and predictable amount of time.     Equipment Recommendations    BSC/3in1;Other (comment) (RW)     Recommendations for Other Services         Precautions/Restrictions   Precautions Precautions: Fall Recall of Precautions/Restrictions: Intact Restrictions Weight Bearing Restrictions Per Provider Order: Yes LLE Weight Bearing Per Provider Order: Weight bearing as tolerated     Mobility Bed Mobility               General bed mobility comments: NT pt in recliner pre/post session    Transfers Overall transfer level: Needs assistance Equipment used: Rolling walker (2 wheels) Transfers: Sit to/from Stand Sit to Stand: Mod assist, From elevated surface           General transfer comment: cues and assist for lift off from recliner and toilet d/t low height; ambulated well using RW with CGA      Balance Overall balance assessment: Needs assistance Sitting-balance support: Feet supported Sitting balance-Leahy Scale: Good     Standing balance support: During functional activity, Reliant on assistive device for balance Standing balance-Leahy Scale: Fair Standing balance comment: RW and CGA                           ADL either performed or assessed with clinical judgement   ADL Overall ADL's : Needs assistance/impaired                     Lower Body Dressing: Minimal assistance;Sitting/lateral leans Lower Body Dressing Details (indicate cue type and reason): donn/doff socks Toilet Transfer: Moderate assistance;Rolling walker (2 wheels);Regular Toilet;Grab bars Toilet Transfer Details (indicate cue type and reason): edu on use of BSC  over toilet         Functional mobility during ADLs: Contact guard assist;Rolling walker (2 wheels)       Vision         Perception         Praxis         Pertinent Vitals/Pain Pain Assessment Pain Assessment: No/denies pain Pain Location: L hip Pain Descriptors / Indicators: Aching Pain Intervention(s): Monitored during session, Repositioned      Extremity/Trunk Assessment Upper Extremity Assessment Upper Extremity Assessment: Overall WFL for tasks assessed   Lower Extremity Assessment Lower Extremity Assessment: Generalized weakness;LLE deficits/detail LLE Deficits / Details: s/p pinning of L hip fx       Communication Communication Communication: No apparent difficulties   Cognition Arousal: Alert Behavior During Therapy: WFL for tasks assessed/performed                                 Following commands: Intact       Cueing  General Comments   Cueing Techniques: Verbal cues      Exercises Other Exercises Other Exercises: Edu on role of OT in acute setting, DC recommendations and DME for safety/ease with ADLs upon DC. Other Exercises: Edu on proper LB dressing to dress affected side first d/t weakness, pain, as well as sliding LLE forward prior to sitting to decrease pressure/pain on hip.   Shoulder Instructions      Home Living Family/patient expects to be discharged to:: Private residence Living Arrangements: Children Available Help at Discharge: Family;Available PRN/intermittently;Available 24 hours/day Type of Home: House Home Access: Stairs to enter [employer] of Steps: 1 Entrance Stairs-Rails: None Home Layout: One level;Two level;Able to live on main level with bedroom/bathroom     Bathroom Shower/Tub: Chief Strategy Officer: Standard Bathroom Accessibility: Yes   Home Equipment: Cane - single point;Wheelchair - manual;Grab bars - toilet;Grab bars - tub/shower          Prior Functioning/Environment Prior Level of Function : Independent/Modified Independent;History of Falls (last six months)             Mobility Comments: MOD I with SPC use, household and community distances; 3 falls in the last 6 months ADLs Comments: IND with ADLs    OT Problem List: Decreased strength;Decreased activity tolerance;Impaired balance (sitting and/or  standing)   OT Treatment/Interventions: Self-care/ADL training;Therapeutic exercise;Therapeutic activities;DME and/or AE instruction;Patient/family education;Balance training      OT Goals(Current goals can be found in the care plan section)   Acute Rehab OT Goals Patient Stated Goal: go home OT Goal Formulation: With patient Time For Goal Achievement: 06/27/24 Potential to Achieve Goals: Good ADL Goals Pt Will Perform Grooming: with modified independence;standing Pt Will Perform Lower Body Dressing: with supervision;sitting/lateral leans;sit to/from stand;with adaptive equipment Pt Will Transfer to Toilet: with modified independence;with supervision;ambulating   OT Frequency:  Min 2X/week    Co-evaluation              AM-PAC OT 6 Clicks Daily Activity     Outcome Measure Help from another person eating meals?: A Little Help from another person taking care of personal grooming?: A Little Help from another person toileting, which includes using toliet, bedpan, or urinal?: A Little Help from another person bathing (including washing, rinsing, drying)?: A Little Help from another person to put on and taking off regular upper body clothing?: A Little Help from another person to put on  and taking off regular lower body clothing?: A Little 6 Click Score: 18   End of Session Equipment Utilized During Treatment: Gait belt;Rolling walker (2 wheels) Nurse Communication: Mobility status  Activity Tolerance: Patient tolerated treatment well Patient left: in chair;with call bell/phone within reach;with family/visitor present  OT Visit Diagnosis: Other abnormalities of gait and mobility (R26.89);Muscle weakness (generalized) (M62.81)                Time: 1450-1510 OT Time Calculation (min): 20 min Charges:  OT General Charges $OT Visit: 1 Visit OT Evaluation $OT Eval Moderate Complexity: 1 Mod  Aston Lawhorn Chrismon, OTR/L 06/13/24, 4:50 PM  Caileen Veracruz E Chrismon 06/13/2024, 4:47  PM

## 2024-06-13 NOTE — Progress Notes (Signed)
  Subjective: 1 Day Post-Op Procedure(s) (LRB): FIXATION, FEMUR, NECK, PERCUTANEOUS, USING SCREW (Left) Patient reports pain as mild.  Family is in the room. Patient is well, and has had no acute complaints or problems Plan is to go Home with home health physical therapy after hospital stay. Negative for chest pain and shortness of breath Fever: no Gastrointestinal: Negative for nausea and vomiting  Objective: Vital signs in last 24 hours: Temp:  [97.5 F (36.4 C)-98.3 F (36.8 C)] 97.8 F (36.6 C) (11/24 0452) Pulse Rate:  [72-86] 74 (11/24 0452) Resp:  [14-20] 18 (11/24 0452) BP: (93-116)/(52-94) 108/70 (11/24 0452) SpO2:  [93 %-100 %] 96 % (11/24 0452) Weight:  [68.6 kg] 68.6 kg (11/24 0500)  Intake/Output from previous day:  Intake/Output Summary (Last 24 hours) at 06/13/2024 0730 Last data filed at 06/12/2024 1800 Gross per 24 hour  Intake 980 ml  Output --  Net 980 ml    Intake/Output this shift: No intake/output data recorded.  Labs: Recent Labs    06/11/24 1647 06/12/24 0543 06/13/24 0342  HGB 12.5 11.4* 10.3*   Recent Labs    06/12/24 0543 06/13/24 0342  WBC 6.0 9.5  RBC 3.39* 3.05*  HCT 35.0* 32.6*  PLT 170 152   Recent Labs    06/12/24 0543 06/13/24 0342  NA 140 138  K 3.7 4.3  CL 101 103  CO2 29 27  BUN 22 26*  CREATININE 1.01* 1.02*  GLUCOSE 88 130*  CALCIUM 9.0 8.4*   No results for input(s): LABPT, INR in the last 72 hours.   EXAM General - Patient is Alert and Oriented Extremity - Neurovascular intact Sensation intact distally Dorsiflexion/Plantar flexion intact Compartment soft Dressing/Incision - clean, dry, no drainage Motor Function - intact, moving foot and toes well on exam.   Past Medical History:  Diagnosis Date   Allergic state    Arthritis    osteoporosis   Asthma    History of colon polyps    Hyperlipidemia    Osteoporosis, post-menopausal    Peripheral neuropathy, idiopathic     Assessment/Plan: 1  Day Post-Op Procedure(s) (LRB): FIXATION, FEMUR, NECK, PERCUTANEOUS, USING SCREW (Left) Principal Problem:   Closed left hip fracture (HCC)  Estimated body mass index is 25.17 kg/m as calculated from the following:   Height as of this encounter: 5' 5 (1.651 m).   Weight as of this encounter: 68.6 kg. Advance diet Up with therapy D/C IV fluids Discharge home with home health when she completes physical therapy goals..  Plan to follow-up at [facility] clinic orthopedics in 2 weeks for staple removal and x-rays of the right hip  DVT Prophylaxis - Lovenox  and TED hose Weight-Bearing as tolerated to left leg  Krystal Doyne, PA-C Orthopaedic Surgery 06/13/2024, 7:30 AM

## 2024-06-13 NOTE — TOC CM/SW Note (Signed)
 Transition of Care (TOC) CM/SW Note   .Patient is not able to walk the distance required to go the bathroom, or he/she is unable to safely negotiate stairs required to access the bathroom.  A 3in1 BSC will alleviate this problem

## 2024-06-13 NOTE — TOC Initial Note (Signed)
 Transition of Care [facility]) - Initial/Assessment Note    Patient Details  Name: Tiffany Walton MRN: [id number] Date of Birth: [birth year]  Transition of Care [facility]) CM/SW Contact:    Alvaro Louder, LCSW Phone Number: 06/13/2024, 4:10 PM  Clinical Narrative:    Patient is from home and PCP is Lynwood Null. LCSWA faxed out patient to [facility] agencies in [place]. Patient indicated that she wants to review the list with her daughter and will update tomorrow morning.   TOC to follow for discharge.                   Patient Goals and CMS Choice            Expected Discharge Plan and Services                                              Prior Living Arrangements/Services                       Activities of Daily Living   ADL Screening (condition at time of admission) Independently performs ADLs?: Yes (appropriate for developmental age) Is the patient deaf or have difficulty hearing?: No Does the patient have difficulty seeing, even when wearing glasses/contacts?: Yes Does the patient have difficulty concentrating, remembering, or making decisions?: Yes  Permission Sought/Granted                  Emotional Assessment              Admission diagnosis:  Closed left hip fracture (HCC) [S72.002A] Fall, initial encounter [W19.XXXA] Closed fracture of neck of left femur, initial encounter [facility]) [S72.002A] Patient Active Problem List   Diagnosis Date Noted   Closed left hip fracture (HCC) 06/11/2024   PCP:  Null Lynwood FALCON, MD Pharmacy:   [facility] [address], [place] - [address] [place] [address] Phone: [phone] Fax: [phone]     Social Drivers of Health (SDOH) Social History: SDOH Screenings   Food Insecurity: No Food Insecurity (06/11/2024)  Housing: Low Risk  (06/11/2024)  Transportation Needs: No Transportation Needs (06/11/2024)  Utilities: Not At Risk (06/11/2024)  Financial  Resource Strain: Patient Declined (04/20/2023)   Received from [facility]  Social Connections: Unknown (06/11/2024)  Tobacco Use: Low Risk  (06/11/2024)   SDOH Interventions:     Readmission Risk Interventions     No data to display

## 2024-06-13 NOTE — Plan of Care (Signed)

## 2024-06-13 NOTE — Progress Notes (Signed)
 Progress Note   Patient: Tiffany Walton FMW:[id number] DOB: 02-24-1941 DOA: 06/11/2024     2 DOS: the patient was seen and examined on 06/13/2024   Brief hospital course: From HPI MICHIAH MASSE is a 83 y.o. female with medical history significant of arthritis, asthma, hyperlipidemia, peripheral neuropathy who was otherwise well until last night when the patient accidentally rolled out of bed landing on the left hip.  Started feeling some left hip pain and took some over-the-counter medication however pain did not improve and patient this morning began having difficulty walking and therefore came to the emergency room for further management.  Pain is located to the left hip with intensity 5/10 relieved by pain medication administered in the emergency room. Patient did not lose consciousness, denies nausea vomiting abdominal pain chest pain cough or urinary complaint   ED course: Upon presentation to the emergency room temperature 99.4, respiratory rate 17, pulse 87, blood pressure 98/56 saturating 95% on room air Chest x-ray did not show acute pathology CT scan of the left hip showed a left femoral neck fracture with mild impaction, ED physician discussed the case with orthopedic surgeon who agreed for patient to be admitted for surgical intervention tomorrow.  We will keep patient n.p.o. after midnight.     Assessment and Plan:      Closed left femoral neck fracture Orthopedics Dr. Ezra on board and case discussed Patient underwent surgical repair on 06/12/2024 Diet has been resumed Continue as needed pain medication Continue PT OT     Chronic systolic congestive heart failure Currently not in acute exacerbation Echo on November 2024 showed EF of 25% Resume home medication as blood pressure allows Monitor input and output Daily weight   Chronic asthma Continue as needed nebulization   Peripheral neuropathy Patient used to be on gabapentin  however this has been  discontinued continue   DVT prophylaxis-Lovenox    Subjective:  Patient complain of pain in the hip area She lost her IV access Have been seen by PT OT with recommendation for home health of Patient he will be able to go home by tomorrow   Physical Exam: Constitutional: Elderly female laying in bed in no acute distress HENT:     Head: Normocephalic and atraumatic.  Cardiovascular:     Rate and Rhythm: Normal rate and regular rhythm.     Heart sounds: Normal heart sounds.  Pulmonary:     Effort: Pulmonary effort is normal.     Breath sounds: Normal breath sounds.  Abdominal:     Palpations: Abdomen is soft.     Tenderness: There is no abdominal tenderness.  Neurological:     Mental Status: Mental status is at baseline.  Musculoskeletal: Left leg internally rotated Tenderness noted to the left hip area    Data Reviewed:    Latest Ref Rng & Units 06/13/2024    3:42 AM 06/12/2024    5:43 AM 06/11/2024    4:47 PM  BMP  Glucose 70 - 99 mg/dL 869  88  881   BUN 8 - 23 mg/dL 26  22  24    Creatinine 0.44 - 1.00 mg/dL 8.97  8.98  8.80   Sodium 135 - 145 mmol/L 138  140  139   Potassium 3.5 - 5.1 mmol/L 4.3  3.7  3.6   Chloride 98 - 111 mmol/L 103  101  100   CO2 22 - 32 mmol/L 27  29  28    Calcium 8.9 - 10.3 mg/dL 8.4  9.0  9.6        Latest Ref Rng & Units 06/13/2024    3:42 AM 06/12/2024    5:43 AM 06/11/2024    4:47 PM  CBC  WBC 4.0 - 10.5 K/uL 9.5  6.0  10.1   Hemoglobin 12.0 - 15.0 g/dL 89.6  88.5  87.4   Hematocrit 36.0 - 46.0 % 32.6  35.0  37.3   Platelets 150 - 400 K/uL 152  170  189     Vitals:   06/13/24 0500 06/13/24 0743 06/13/24 1256 06/13/24 1542  BP:  93/64 126/69 (!) 92/50  Pulse:  78  70  Resp:  16  16  Temp:  99.1 F (37.3 C)  98.4 F (36.9 C)  TempSrc:      SpO2:  95%  99%  Weight: 68.6 kg     Height:        Author: Drue ONEIDA Potter, MD 06/13/2024 4:22 PM  For on call review [email or web address].

## 2024-06-13 NOTE — Discharge Instructions (Signed)
 INSTRUCTIONS AFTER Surgery  Remove items at home which could result in a fall. This includes throw rugs or furniture in walking pathways ICE to the affected joint every three hours while awake for 30 minutes at a time, for at least the first 3-5 days, and then as needed for pain and swelling.  Continue to use ice for pain and swelling. You may notice swelling that will progress down to the foot and ankle.  This is normal after surgery.  Elevate your leg when you are not up walking on it.   Continue to use the breathing machine you got in the hospital (incentive spirometer) which will help keep your temperature down.  It is common for your temperature to cycle up and down following surgery, especially at night when you are not up moving around and exerting yourself.  The breathing machine keeps your lungs expanded and your temperature down.   DIET:  As you were doing prior to hospitalization, we recommend a well-balanced diet.  DRESSING / WOUND CARE / SHOWERING  Dressing changes needed.  No showering.  Patient will follow-up at [facility] clinic orthopedics for staple removal and x-rays of the left hip  ACTIVITY  Increase activity slowly as tolerated, but follow the weight bearing instructions below.   No driving for 6 weeks or until further direction given by your physician.  You cannot drive while taking narcotics.  No lifting or carrying greater than 10 lbs. until further directed by your surgeon. Avoid periods of inactivity such as sitting longer than an hour when not asleep. This helps prevent blood clots.  You may return to work once you are authorized by your doctor.     WEIGHT BEARING  Weightbearing as tolerated on the left   EXERCISES Gait training and ambulation training with physical therapy  CONSTIPATION  Constipation is defined medically as fewer than three stools per week and severe constipation as less than one stool per week.  Even if you have a regular bowel pattern at  home, your normal regimen is likely to be disrupted due to multiple reasons following surgery.  Combination of anesthesia, postoperative narcotics, change in appetite and fluid intake all can affect your bowels.   YOU MUST use at least one of the following options; they are listed in order of increasing strength to get the job done.  They are all available over the counter, and you may need to use some, POSSIBLY even all of these options:    Drink plenty of fluids (prune juice may be helpful) and high fiber foods Colace 100 mg by mouth twice a day  Senokot for constipation as directed and as needed Dulcolax (bisacodyl), take with full glass of water  Miralax (polyethylene glycol) once or twice a day as needed.  If you have tried all these things and are unable to have a bowel movement in the first 3-4 days after surgery call either your surgeon or your primary doctor.    If you experience loose stools or diarrhea, hold the medications until you stool forms back up.  If your symptoms do not get better within 1 week or if they get worse, check with your doctor.  If you experience the worst abdominal pain ever or develop nausea or vomiting, please contact the office immediately for further recommendations for treatment.   ITCHING:  If you experience itching with your medications, try taking only a single pain pill, or even half a pain pill at a time.  You can also  use Benadryl over the counter for itching or also to help with sleep.   TED HOSE STOCKINGS:  Use stockings on both legs until for at least 2 weeks or as directed by physician office. They may be removed at night for sleeping.  MEDICATIONS:  See your medication summary on the "After Visit Summary" that nursing will review with you.  You may have some home medications which will be placed on hold until you complete the course of blood thinner medication.  It is important for you to complete the blood thinner medication as  prescribed.  PRECAUTIONS:  If you experience chest pain or shortness of breath - call 911 immediately for transfer to the hospital emergency department.   If you develop a fever greater that 101 F, purulent drainage from wound, increased redness or drainage from wound, foul odor from the wound/dressing, or calf pain - CONTACT YOUR SURGEON.                                                   FOLLOW-UP APPOINTMENTS:  If you do not already have a post-op appointment, please call the office for an appointment to be seen by your surgeon.  Guidelines for how soon to be seen are listed in your "After Visit Summary", but are typically between 1-4 weeks after surgery.  OTHER INSTRUCTIONS:     MAKE SURE YOU:  Understand these instructions.  Get help right away if you are not doing well or get worse.    Thank you for letting us  be a part of your medical care team.  It is a privilege we respect greatly.  We hope these instructions will help you stay on track for a fast and full recovery!

## 2024-06-14 ENCOUNTER — Other Ambulatory Visit: Payer: Self-pay

## 2024-06-14 DIAGNOSIS — Z8781 Personal history of (healed) traumatic fracture: Secondary | ICD-10-CM

## 2024-06-14 DIAGNOSIS — S72002A Fracture of unspecified part of neck of left femur, initial encounter for closed fracture: Secondary | ICD-10-CM | POA: Diagnosis not present

## 2024-06-14 LAB — BASIC METABOLIC PANEL WITH GFR
Anion gap: 8 (ref 5–15)
BUN: 29 mg/dL — ABNORMAL HIGH (ref 8–23)
CO2: 28 mmol/L (ref 22–32)
Calcium: 8.5 mg/dL — ABNORMAL LOW (ref 8.9–10.3)
Chloride: 101 mmol/L (ref 98–111)
Creatinine, Ser: 1 mg/dL (ref 0.44–1.00)
GFR, Estimated: 56 mL/min — ABNORMAL LOW (ref >60)
Glucose, Bld: 103 mg/dL — ABNORMAL HIGH (ref 70–99)
Potassium: 3.9 mmol/L (ref 3.5–5.1)
Sodium: 137 mmol/L (ref 135–145)

## 2024-06-14 LAB — CBC WITH DIFFERENTIAL/PLATELET
Abs Immature Granulocytes: 0.02 K/uL (ref 0.00–0.07)
Basophils Absolute: 0 K/uL (ref 0.0–0.1)
Basophils Relative: 0 %
Eosinophils Absolute: 0.3 K/uL (ref 0.0–0.5)
Eosinophils Relative: 5 %
HCT: 29.7 % — ABNORMAL LOW (ref 36.0–46.0)
Hemoglobin: 9.5 g/dL — ABNORMAL LOW (ref 12.0–15.0)
Immature Granulocytes: 0 %
Lymphocytes Relative: 16 %
Lymphs Abs: 0.9 K/uL (ref 0.7–4.0)
MCH: 34.3 pg — ABNORMAL HIGH (ref 26.0–34.0)
MCHC: 32 g/dL (ref 30.0–36.0)
MCV: 107.2 fL — ABNORMAL HIGH (ref 80.0–100.0)
Monocytes Absolute: 0.9 K/uL (ref 0.1–1.0)
Monocytes Relative: 16 %
Neutro Abs: 3.7 K/uL (ref 1.7–7.7)
Neutrophils Relative %: 63 %
Platelets: 146 K/uL — ABNORMAL LOW (ref 150–400)
RBC: 2.77 MIL/uL — ABNORMAL LOW (ref 3.87–5.11)
RDW: 12.9 % (ref 11.5–15.5)
WBC: 5.8 K/uL (ref 4.0–10.5)
nRBC: 0 % (ref 0.0–0.2)

## 2024-06-14 MED ORDER — ENOXAPARIN SODIUM 40 MG/0.4ML IJ SOSY
40.0000 mg | PREFILLED_SYRINGE | INTRAMUSCULAR | 0 refills | Status: AC
  Filled 2024-06-14: qty 5.6, 14d supply, fill #0

## 2024-06-14 MED ORDER — ACETAMINOPHEN 325 MG PO TABS
650.0000 mg | ORAL_TABLET | Freq: Four times a day (QID) | ORAL | 0 refills | Status: AC | PRN
  Filled 2024-06-14: qty 60, 8d supply, fill #0

## 2024-06-14 MED ORDER — TRAMADOL HCL 50 MG PO TABS
50.0000 mg | ORAL_TABLET | Freq: Four times a day (QID) | ORAL | 0 refills | Status: AC | PRN
Start: 2024-06-14 — End: 2024-06-19
  Filled 2024-06-14: qty 20, 5d supply, fill #0

## 2024-06-14 MED ORDER — HYDROXYZINE HCL 25 MG PO TABS: 25.0000 mg | ORAL_TABLET | Freq: Three times a day (TID) | ORAL | Status: DC | PRN

## 2024-06-14 NOTE — Progress Notes (Signed)
  Subjective: 2 Days Post-Op Procedure(s) (LRB): FIXATION, FEMUR, NECK, PERCUTANEOUS, USING SCREW (Left) Patient reports pain as mild.  Family is in the room. Patient is well, and has had no acute complaints or problems Plan is to go Home with home health physical therapy after hospital stay. Negative for chest pain and shortness of breath Fever: no Gastrointestinal: Negative for nausea and vomiting  Objective: Vital signs in last 24 hours: Temp:  [97.8 F (36.6 C)-99.1 F (37.3 C)] 98.5 F (36.9 C) (11/25 0447) Pulse Rate:  [70-78] 70 (11/25 0447) Resp:  [16] 16 (11/25 0447) BP: (92-126)/(50-69) 119/58 (11/25 0447) SpO2:  [95 %-99 %] 98 % (11/25 0447) Weight:  [68.9 kg] 68.9 kg (11/25 0500)  Intake/Output from previous day:  Intake/Output Summary (Last 24 hours) at 06/14/2024 9277 Last data filed at 06/13/2024 0900 Gross per 24 hour  Intake 273.22 ml  Output --  Net 273.22 ml    Intake/Output this shift: No intake/output data recorded.  Labs: Recent Labs    06/11/24 1647 06/12/24 0543 06/13/24 0342 06/14/24 0405  HGB 12.5 11.4* 10.3* 9.5*   Recent Labs    06/13/24 0342 06/14/24 0405  WBC 9.5 5.8  RBC 3.05* 2.77*  HCT 32.6* 29.7*  PLT 152 146*   Recent Labs    06/13/24 0342 06/14/24 0405  NA 138 137  K 4.3 3.9  CL 103 101  CO2 27 28  BUN 26* 29*  CREATININE 1.02* 1.00  GLUCOSE 130* 103*  CALCIUM 8.4* 8.5*   No results for input(s): LABPT, INR in the last 72 hours.   EXAM General - Patient is Alert and Oriented Extremity - Neurovascular intact Sensation intact distally Dorsiflexion/Plantar flexion intact Compartment soft Dressing/Incision - clean, dry, no drainage Motor Function - intact, moving foot and toes well on exam.  Ambulated 200 feet with physical therapy.  Past Medical History:  Diagnosis Date   Allergic state    Arthritis    osteoporosis   Asthma    History of colon polyps    Hyperlipidemia    Osteoporosis,  post-menopausal    Peripheral neuropathy, idiopathic     Assessment/Plan: 2 Days Post-Op Procedure(s) (LRB): FIXATION, FEMUR, NECK, PERCUTANEOUS, USING SCREW (Left) Principal Problem:   Closed left hip fracture (HCC)  Estimated body mass index is 25.28 kg/m as calculated from the following:   Height as of this encounter: 5' 5 (1.651 m).   Weight as of this encounter: 68.9 kg. Advance diet Up with therapy D/C IV fluids Discharge home with home health when she completes physical therapy goals..  Plan to follow-up at [facility] clinic orthopedics in 2 weeks for staple removal and x-rays of the right hip  DVT Prophylaxis - Lovenox  and TED hose Weight-Bearing as tolerated to left leg  Krystal Doyne, PA-C Orthopaedic Surgery 06/14/2024, 7:22 AM

## 2024-06-14 NOTE — Plan of Care (Signed)

## 2024-06-14 NOTE — Plan of Care (Signed)

## 2024-06-14 NOTE — Progress Notes (Signed)
 All discharge requirements met.

## 2024-06-14 NOTE — Discharge Summary (Signed)
 Physician Discharge Summary   Patient: Tiffany Walton MRN: [id number] DOB: Dec 27, 1940  Admit date:     06/11/2024  Discharge date: 06/14/24  Discharge Physician: Drue ONEIDA Potter   PCP: Valora Lynwood FALCON, MD   Recommendations at discharge:  Follow-up with orthopedics  Discharge Diagnoses: Principal Problem:   Closed left hip fracture (HCC)  Resolved Problems:   * No resolved hospital problems. [facility] Course:  From HPI Tiffany Walton is a 83 y.o. female with medical history significant of arthritis, asthma, hyperlipidemia, peripheral neuropathy who was otherwise well until last night when the patient accidentally rolled out of bed landing on the left hip.  Started feeling some left hip pain and took some over-the-counter medication however pain did not improve and patient this morning began having difficulty walking and therefore came to the emergency room for further management.  Pain is located to the left hip with intensity 5/10 relieved by pain medication administered in the emergency room. Patient did not lose consciousness, denies nausea vomiting abdominal pain chest pain cough or urinary complaint   ED course: Upon presentation to the emergency room temperature 99.4, respiratory rate 17, pulse 87, blood pressure 98/56 saturating 95% on room air Chest x-ray did not show acute pathology CT scan of the left hip showed a left femoral neck fracture with mild impaction, ED physician discussed the case with orthopedic surgeon who agreed for patient to be admitted for surgical intervention tomorrow.  We will keep patient n.p.o. after midnight.     Assessment and Plan:  Closed left femoral neck fracture Orthopedics Dr. Ezra on board and case discussed Patient underwent surgical repair on 06/12/2024 Diet has been resumed Continue as needed pain medication Outpatient follow-up with orthopedics   Chronic systolic congestive heart failure Currently not in acute  exacerbation Echo on November 2024 showed EF of 25% Heart failure medication on hold given borderline low blood pressure   Chronic asthma Continue as needed nebulization   Peripheral neuropathy Patient used to be on gabapentin  however this has been discontinued continue  Consultants: Orthopedics Procedures performed: Left hip fracture repair Disposition: Home health Diet recommendation:  Cardiac diet DISCHARGE MEDICATION: Allergies as of 06/14/2024       Reactions   Penicillins Shortness Of Breath   Shellfish Allergy Anaphylaxis        Medication List     STOP taking these medications    Apixaban  Starter Pack (10mg  and 5mg ) Commonly known as: ELIQUIS  STARTER PACK   furosemide 20 MG tablet Commonly known as: LASIX   gabapentin  100 MG capsule Commonly known as: NEURONTIN    metoprolol succinate 25 MG 24 hr tablet Commonly known as: TOPROL-XL   predniSONE 10 MG tablet Commonly known as: DELTASONE   raloxifene 60 MG tablet Commonly known as: EVISTA   sacubitril-valsartan 97-103 MG Commonly known as: ENTRESTO   spironolactone 25 MG tablet Commonly known as: ALDACTONE   torsemide 20 MG tablet Commonly known as: DEMADEX       TAKE these medications    acetaminophen  325 MG tablet Commonly known as: TYLENOL  Take 2 tablets (650 mg total) by mouth every 6 (six) hours as needed for moderate pain (pain score 4-6), mild pain (pain score 1-3), fever or headache.   albuterol  1.25 MG/3ML nebulizer solution Commonly known as: ACCUNEB  Take 1 ampule by nebulization 2 (two) times daily.   albuterol  108 (90 Base) MCG/ACT inhaler Commonly known as: VENTOLIN  HFA Inhale 2 puffs into the lungs every 6 (six) hours  as needed for wheezing or shortness of breath.   ascorbic acid 500 MG tablet Commonly known as: VITAMIN C Take 500 mg by mouth daily.   b complex vitamins capsule Take 1 capsule by mouth daily.   budesonide-formoterol 160-4.5 MCG/ACT inhaler Commonly  known as: SYMBICORT Inhale 2 puffs into the lungs 2 (two) times daily.   cholecalciferol 10 MCG (400 UNIT) Tabs tablet Commonly known as: VITAMIN D3 Take 1,000 Units by mouth daily.   COD LIVER OIL PO Take by mouth daily.   enoxaparin  40 MG/0.4ML injection Commonly known as: LOVENOX  Inject 0.4 mLs (40 mg total) into the skin daily for 14 days.   Jardiance 10 MG Tabs tablet Generic drug: empagliflozin Take 10 mg by mouth daily.   montelukast 10 MG tablet Commonly known as: SINGULAIR Take 10 mg by mouth at bedtime.   traMADol  50 MG tablet Commonly known as: Ultram  Take 1 tablet (50 mg total) by mouth every 6 (six) hours as needed for up to 5 days.   Trelegy Ellipta 200-62.5-25 MCG/ACT Aepb Generic drug: Fluticasone-Umeclidin-Vilant Inhale 1 puff into the lungs daily.   vitamin E 180 MG (400 UNITS) capsule Take 400 Units by mouth daily.               Durable Medical Equipment  (From admission, onward)           Start     Ordered   06/14/24 1402  For home use only DME lightweight manual wheelchair with seat cushion  Once       Comments: Patient suffers from a left hip fracture which impairs their ability to perform daily activities like bathing and toileting in the home.  A walker will not resolve  issue with performing activities of daily living. A wheelchair will allow patient to safely perform daily activities. Patient is not able to propel themselves in the home using a standard weight wheelchair due to general weakness. Patient can self propel in the lightweight wheelchair. Length of need 6 months . Accessories: elevating leg rests (ELRs), wheel locks, extensions and anti-tippers.   06/14/24 1402   06/14/24 0850  For home use only DME Walker rolling  Once       Question Answer Comment  Walker: With 5 Inch Wheels   Patient needs a walker to treat with the following condition Ambulatory dysfunction      06/14/24 0849   06/13/24 1531  For home use only DME  Bedside commode  Once       Question:  Patient needs a bedside commode to treat with the following condition  Answer:  Ambulatory dysfunction   06/13/24 1530            Contact information for follow-up providers     Ezra Jackquline RAMAN, MD Follow up in 2 week(s).   Specialty: Orthopedic Surgery Why: For staple removal and x-rays of the left hip Contact information: [address] [place] [address] [phone]              Contact information for after-discharge care     Home Medical Care     [facility]) .   Service: Home Health Services Contact information: [address] [place]  [address] [phone]                    Discharge Exam: Fredricka Weights   06/12/24 0500 06/13/24 0500 06/14/24 0500  Weight: 68.8 kg 68.6 kg  68.9 kg   Constitutional: Elderly female laying in bed in no acute distress HENT:     Head: Normocephalic and atraumatic.  Cardiovascular:     Rate and Rhythm: Normal rate and regular rhythm.     Heart sounds: Normal heart sounds.  Pulmonary:     Effort: Pulmonary effort is normal.     Breath sounds: Normal breath sounds.  Abdominal:     Palpations: Abdomen is soft.     Tenderness: There is no abdominal tenderness.  Neurological:     Mental Status: Mental status is at baseline.  Musculoskeletal: Incisional dressing is clean and dry on the left hip  Condition at discharge: good  The results of significant diagnostics from this hospitalization (including imaging, microbiology, ancillary and laboratory) are listed below for reference.   Imaging Studies: DG HIP UNILAT WITH PELVIS 2-3 VIEWS LEFT Result Date: 06/12/2024 CLINICAL DATA:  [id number] Surgery, elective [id number] EXAM: DG HIP (WITH OR WITHOUT PELVIS) 2-3V LEFT COMPARISON:  June 11, 2024 FINDINGS: Spot fluoroscopy images were obtained for surgical planning purposes. This demonstrates ORIF of the femoral  neck for an impaction fracture of the subcapital femur. Time: 1 minute 43 seconds Dose: 23.81 mGy Please reference procedure report for further details. IMPRESSION: Spot fluoroscopy images obtained for surgical planning purposes. Electronically Signed   By: Corean Salter M.D.   On: 06/12/2024 13:36   DG C-Arm 1-60 Min-No Report Result Date: 06/12/2024 Fluoroscopy was utilized by the requesting physician.  No radiographic interpretation.   CT Hip Left Wo Contrast Result Date: 06/11/2024 EXAM: CT OF THE LEFT HIP WITHOUT IV CONTRAST 06/11/2024 05:44:06 PM TECHNIQUE: CT of the left hip was performed without the administration of intravenous contrast. Multiplanar reformatted images are provided for review. Automated exposure control, iterative reconstruction, and/or weight based adjustment of the mA/kV was utilized to reduce the radiation dose to as low as reasonably achievable. COMPARISON: Plain films earlier today. CLINICAL HISTORY: Hip surgical planning. FINDINGS: BONES: Left femoral neck fracture noted with mild impaction. No subluxation or dislocation. No aggressive appearing osseous abnormality or periostitis. SOFT TISSUE: No significant soft tissue edema or fluid collections. No soft tissue mass. JOINT: No significant degenerative changes. No osseous erosions. INTRAPELVIC CONTENTS: Limited images of the intrapelvic contents are unremarkable. IMPRESSION: 1. Left femoral neck fracture with mild impaction. Electronically signed by: Franky Crease MD 06/11/2024 05:47 PM EST RP Workstation: [id number]   DG Chest 1 View Result Date: 06/11/2024 CLINICAL DATA:  Preoperative evaluation. EXAM: CHEST  1 VIEW COMPARISON:  11/10/2022. FINDINGS: The heart size and mediastinal contours are within normal limits. There is atherosclerotic calcification of the aorta. Lung volumes are low with mild atelectasis at the left lung base. No effusion or pneumothorax is seen. No acute osseous abnormality. IMPRESSION: Low lung  volumes with atelectasis at the left lung base. Electronically Signed   By: Leita Birmingham M.D.   On: 06/11/2024 16:49   DG Hip Unilat W or Wo Pelvis 2-3 Views Left Result Date: 06/11/2024 CLINICAL DATA:  Trauma to the left hip. EXAM: DG HIP (WITH OR WITHOUT PELVIS) 2-3V LEFT COMPARISON:  None Available. FINDINGS: Impacted fracture of the left femoral neck. No dislocation. The soft tissues are unremarkable. IMPRESSION: Impacted fracture of the left femoral neck. Electronically Signed   By: Vanetta Chou M.D.   On: 06/11/2024 16:06    Microbiology: Results for orders placed or performed in visit on 05/16/19  Novel Coronavirus, NAA ([facility])     Status: None   Collection Time:  05/16/19 10:12 AM   Specimen: Nasopharyngeal(NP) swabs in vial transport medium   NASOPHARYNGE  TESTING  Result Value Ref Range Status   SARS-CoV-2, NAA Not Detected Not Detected Final    Comment: This nucleic acid amplification test was developed and its performance characteristics determined by [employer]. Nucleic acid amplification tests include PCR and TMA. This test has not been FDA cleared or approved. This test has been authorized by FDA under an Emergency Use Authorization (EUA). This test is only authorized for the duration of time the declaration that circumstances exist justifying the authorization of the emergency use of in vitro diagnostic tests for detection of SARS-CoV-2 virus and/or diagnosis of COVID-19 infection under section 564(b)(1) of the Act, 21 U.S.C. 639aaa-6(a) (1), unless the authorization is terminated or revoked sooner. When diagnostic testing is negative, the possibility of a false negative result should be considered in the context of a patient's recent exposures and the presence of clinical signs and symptoms consistent with COVID-19. An individual without symptoms of COVID-19 and who is not shedding SARS-CoV-2 virus would  expect to have a negative (not detected) result  in this assay.     Labs: CBC: Recent Labs  Lab 06/11/24 1647 06/12/24 0543 06/13/24 0342 06/14/24 0405  WBC 10.1 6.0 9.5 5.8  NEUTROABS  --   --  8.0* 3.7  HGB 12.5 11.4* 10.3* 9.5*  HCT 37.3 35.0* 32.6* 29.7*  MCV 104.5* 103.2* 106.9* 107.2*  PLT 189 170 152 146*   Basic Metabolic Panel: Recent Labs  Lab 06/11/24 1647 06/12/24 0543 06/13/24 0342 06/14/24 0405  NA 139 140 138 137  K 3.6 3.7 4.3 3.9  CL 100 101 103 101  CO2 28 29 27 28   GLUCOSE 118* 88 130* 103*  BUN 24* 22 26* 29*  CREATININE 1.19* 1.01* 1.02* 1.00  CALCIUM 9.6 9.0 8.4* 8.5*   Liver Function Tests: No results for input(s): AST, ALT, ALKPHOS, BILITOT, PROT, ALBUMIN in the last 168 hours. CBG: No results for input(s): GLUCAP in the last 168 hours.  Discharge time spent:  37 minutes.  Signed: Drue ONEIDA Potter, MD [facility] 06/14/2024

## 2024-07-06 NOTE — Progress Notes (Signed)
 Tiffany Walton                                          MRN: [id number]   07/06/2024   The [facility] Quality Team Specialist reviewed this patient medical record for the purposes of chart review for care gap closure. The following were reviewed: chart review for care gap closure-kidney health evaluation for diabetes:eGFR  and uACR.    VBCI Quality Team
